# Patient Record
Sex: Female | Born: 1970 | Race: White | Hispanic: No | Marital: Married
Health system: Southern US, Community
[De-identification: ages and names within clinical notes are randomized; demographics above are authoritative.]

---

## 2011-09-02 ENCOUNTER — Encounter (HOSPITAL_COMMUNITY): Payer: Self-pay

## 2011-09-08 ENCOUNTER — Other Ambulatory Visit (HOSPITAL_COMMUNITY): Payer: Self-pay | Admitting: *Deleted

## 2011-09-09 ENCOUNTER — Ambulatory Visit (HOSPITAL_COMMUNITY)
Admission: RE | Admit: 2011-09-09 | Discharge: 2011-09-09 | Disposition: A | Discharge status: Home / Self Care | Payer: BC Managed Care – PPO | Source: Ambulatory Visit | Attending: Endocrinology | Admitting: Endocrinology

## 2011-09-09 DIAGNOSIS — R110 Nausea: Secondary | ICD-10-CM | POA: Insufficient documentation

## 2011-09-09 DIAGNOSIS — R5383 Other fatigue: Secondary | ICD-10-CM | POA: Insufficient documentation

## 2011-09-09 DIAGNOSIS — R5381 Other malaise: Secondary | ICD-10-CM | POA: Insufficient documentation

## 2011-09-09 MED ORDER — SODIUM CHLORIDE 0.9 % IV SOLN
Freq: Once | INTRAVENOUS | Status: AC
  Administered 2011-09-09: 09:00:00 via INTRAVENOUS

## 2011-09-09 MED ORDER — COSYNTROPIN 0.25 MG IJ SOLR
0.2500 mg | Freq: Once | INTRAMUSCULAR | Status: AC
  Administered 2011-09-09: 0.25 mg via INTRAVENOUS
  Filled 2011-09-09: qty 0.25

## 2011-09-10 LAB — ACTH: C206 ACTH: 10 pg/mL (ref 10–46)

## 2011-09-10 LAB — ACTH STIMULATION, 3 TIME POINTS: Cortisol, 60 Min: 33.8 ug/dL (ref >20)

## 2011-09-14 ENCOUNTER — Encounter (HOSPITAL_COMMUNITY): Payer: Self-pay

## 2014-12-03 ENCOUNTER — Other Ambulatory Visit: Payer: Self-pay | Admitting: Obstetrics and Gynecology

## 2014-12-03 DIAGNOSIS — N6320 Unspecified lump in the left breast, unspecified quadrant: Secondary | ICD-10-CM

## 2014-12-11 ENCOUNTER — Other Ambulatory Visit: Payer: BC Managed Care – PPO

## 2014-12-13 ENCOUNTER — Ambulatory Visit
Admission: RE | Admit: 2014-12-13 | Discharge: 2014-12-13 | Disposition: A | Discharge status: Home / Self Care | Payer: BC Managed Care – PPO | Source: Ambulatory Visit | Attending: Obstetrics and Gynecology | Admitting: Obstetrics and Gynecology

## 2014-12-13 DIAGNOSIS — N6320 Unspecified lump in the left breast, unspecified quadrant: Secondary | ICD-10-CM

## 2014-12-13 IMAGING — US US BREAST LTD UNI LEFT INC AXILLA
1 series · 4 of 4 positions shown · non-contrast
Comparison: Previous exam(s).

CLINICAL DATA: 44-year-old female presenting for evaluation of a
palpable area of concern in the left breast.

EXAM:
DIGITAL DIAGNOSTIC BILATERAL MAMMOGRAM WITH 3D TOMOSYNTHESIS AND CAD
LEFT BREAST ULTRASOUND

[Series 1: us breast ltd uni left inc axilla · 0.05mm/px · 4 of 4 slices shown]
[im 1/4]
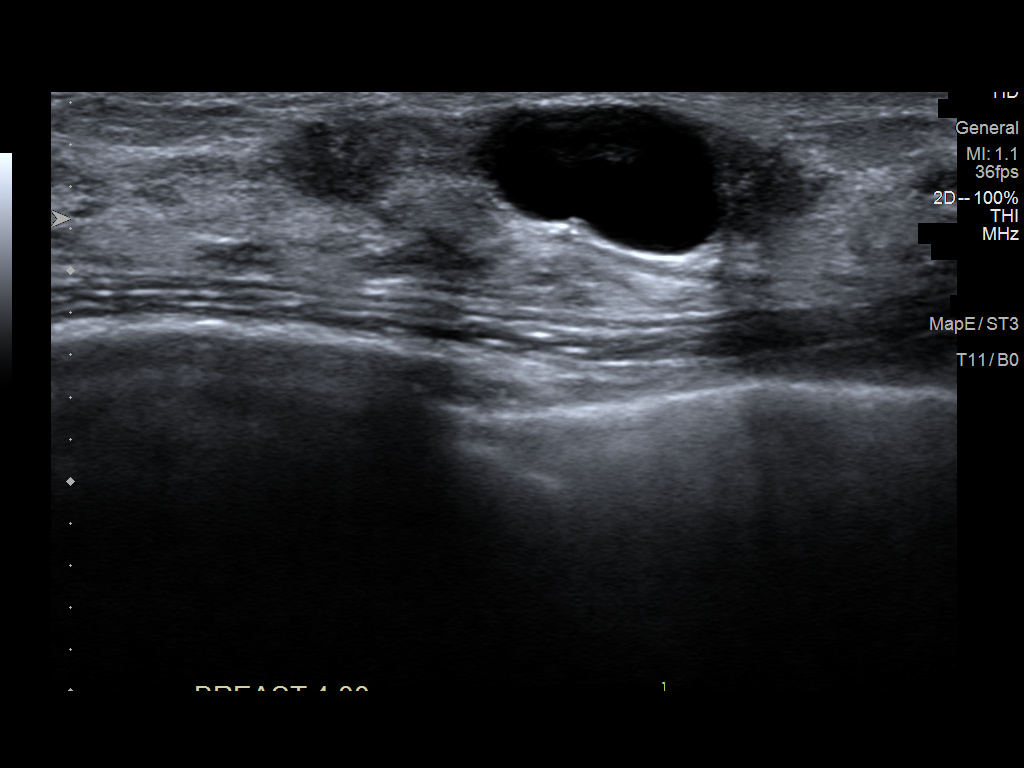
[im 2/4]
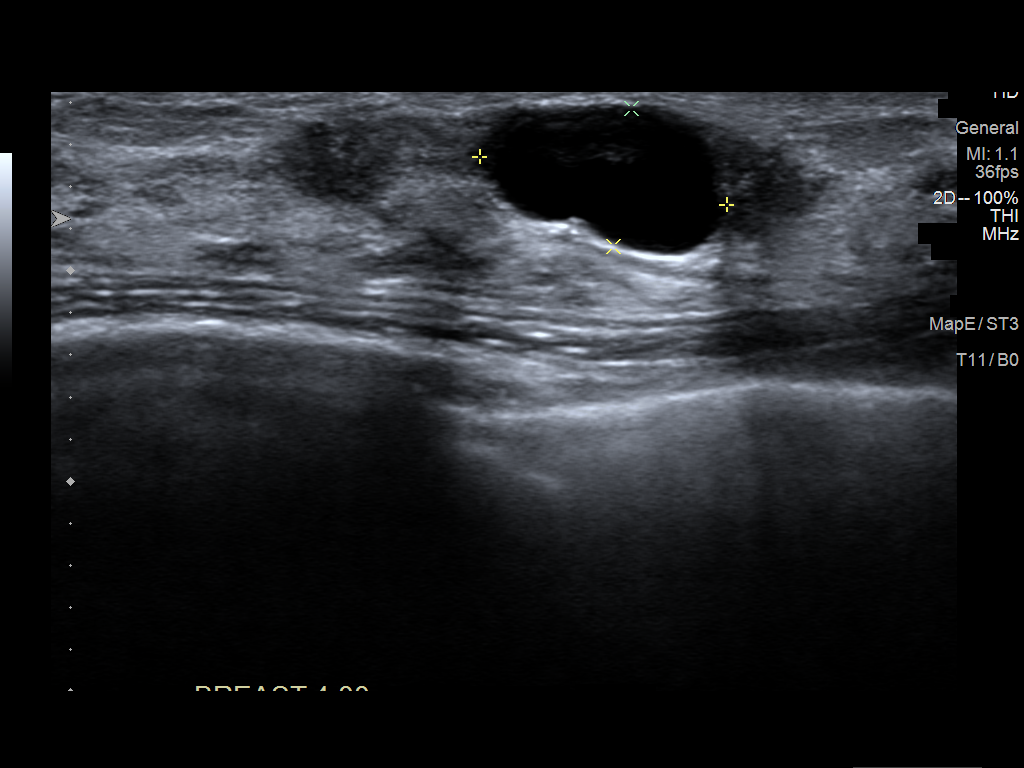
[im 3/4]
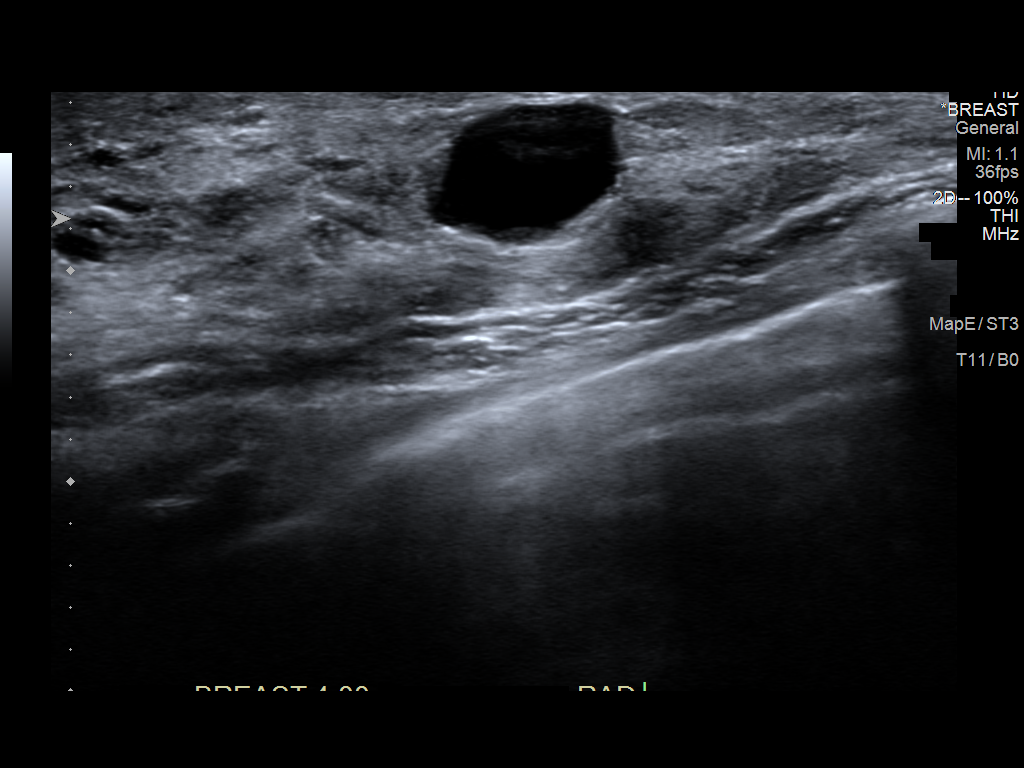
[im 4/4]
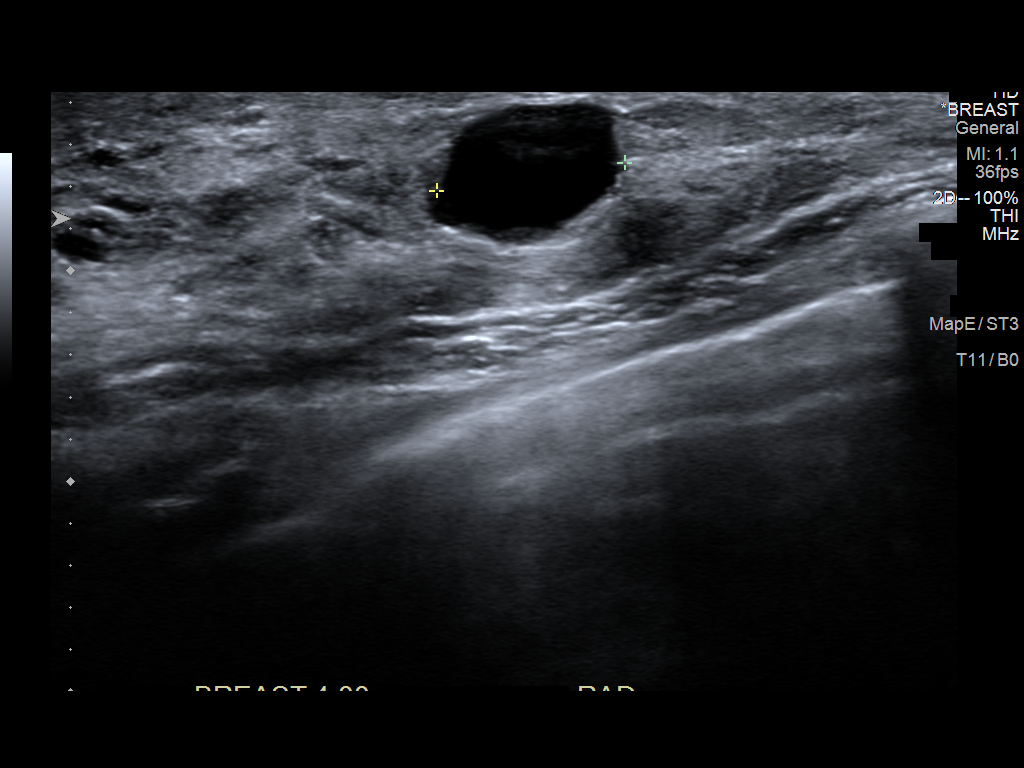

[4 of 4 positions shown; findings below may reference images not displayed]

ACR Breast Density Category d: The breast tissue is extremely dense,
which lowers the sensitivity of mammography.
FINDINGS: A palpable marker has been placed on the lateral left breast in the
anterior depth. No discrete mass or other mammographic abnormalities
are seen deep to the palpable marker. In the medial left breast in
the middle depth, there is a 2 cm area of loosely grouped punctate
calcifications in the medial left breast, which which compared with
the [7L] exam has not significantly changed. There are no suspicious
linear or segmental configurations or suspicious morphology of the
calcifications.

Mammographic images were processed with CAD.

Physical exam of the palpable area of concern in the left breast
demonstrates a firm smooth mobile subcentimeter mass in the
subareolar left breast at 4 o'clock.

Ultrasound targeted to the palpable area demonstrates a
circumscribed anechoic mass with increased posterior acoustic
enhancement measuring 1.2 x 0.7 x 0.9 cm, consistent with a benign
cyst.
IMPRESSION: 1. The palpable mass of concern in the left breast is consistent
with a benign cyst.

2.  No mammographic evidence of malignancy in the bilateral breasts.

RECOMMENDATION:
Screening mammogram in one year.(Code:[7L])

I have discussed the findings and recommendations with the patient.
Results were also provided in writing at the conclusion of the
visit. If applicable, a reminder letter will be sent to the patient
regarding the next appointment.

BI-RADS CATEGORY  2: Benign.

## 2016-07-22 ENCOUNTER — Other Ambulatory Visit: Payer: Self-pay | Admitting: Obstetrics and Gynecology

## 2016-07-22 DIAGNOSIS — Z1231 Encounter for screening mammogram for malignant neoplasm of breast: Secondary | ICD-10-CM

## 2016-07-29 ENCOUNTER — Ambulatory Visit
Admission: RE | Admit: 2016-07-29 | Discharge: 2016-07-29 | Disposition: A | Discharge status: Home / Self Care | Payer: BC Managed Care – PPO | Source: Ambulatory Visit | Attending: Obstetrics and Gynecology | Admitting: Obstetrics and Gynecology

## 2016-07-29 DIAGNOSIS — Z1231 Encounter for screening mammogram for malignant neoplasm of breast: Secondary | ICD-10-CM

## 2016-07-29 IMAGING — MG 2D DIGITAL SCREENING BILATERAL MAMMOGRAM WITH CAD AND ADJUNCT TO
6 series · 6 of 10 positions shown · non-contrast
Comparison: Previous exam(s).

CLINICAL DATA: Screening.

EXAM:
2D DIGITAL SCREENING BILATERAL MAMMOGRAM WITH CAD AND ADJUNCT TOMO

[L MLO]
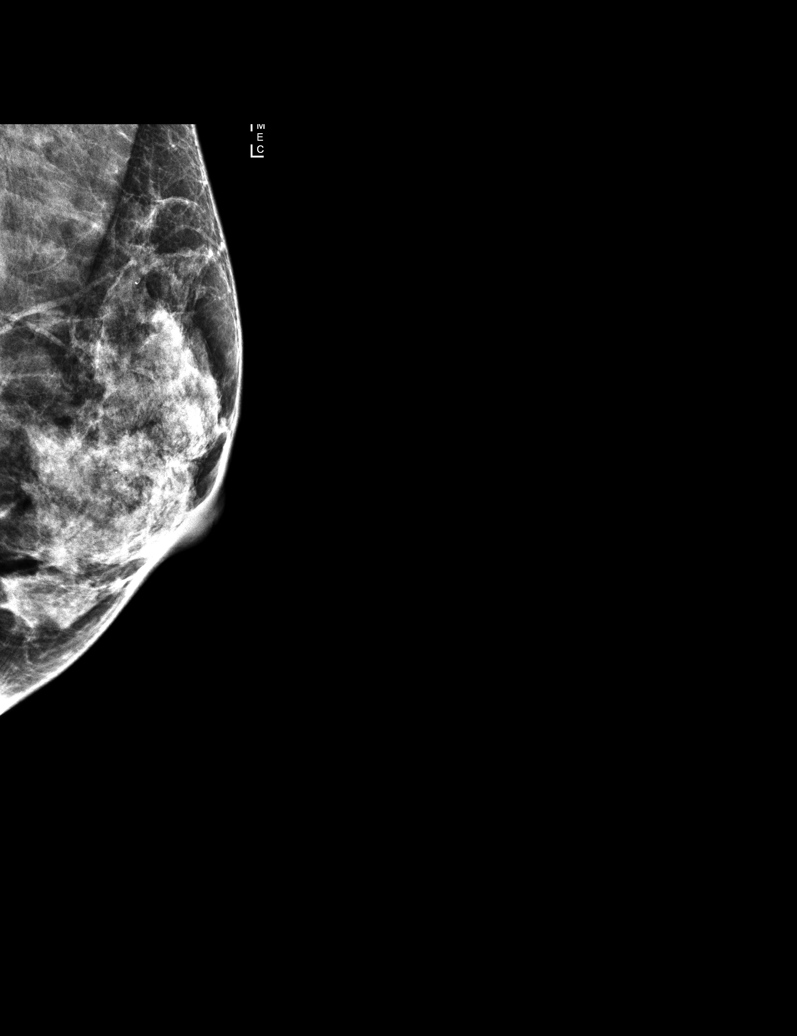

[L CC synth-2D]
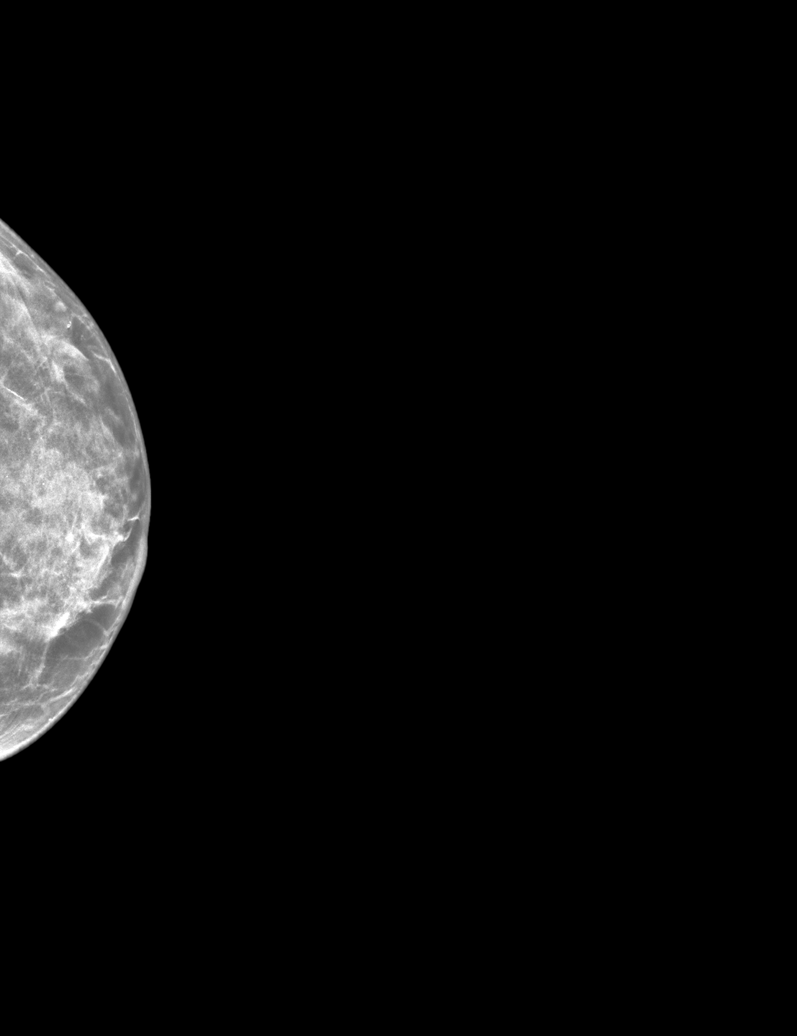

[R MLO synth-2D]
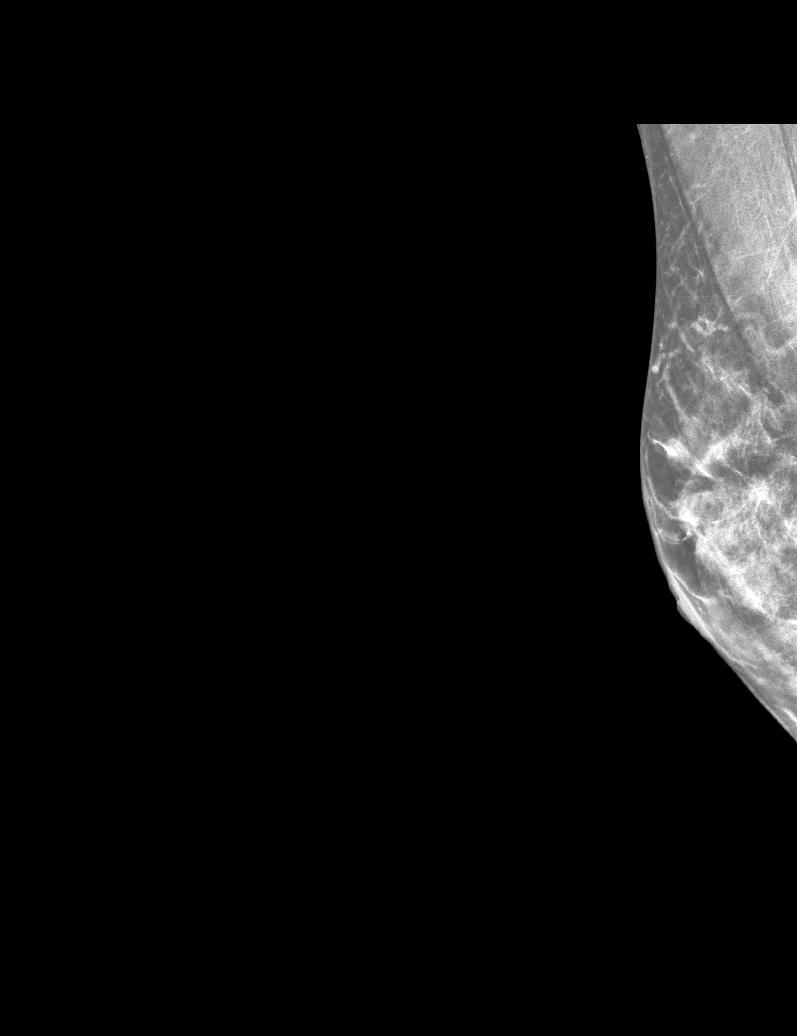

[R CC]
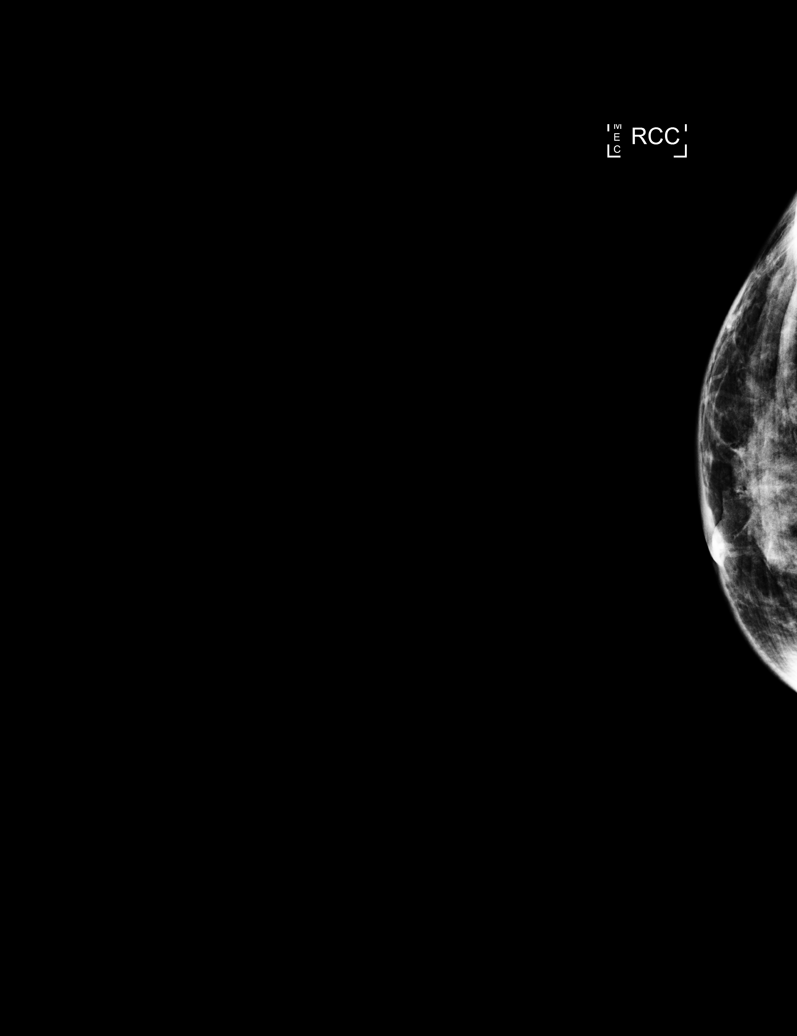

[R CC synth-2D]
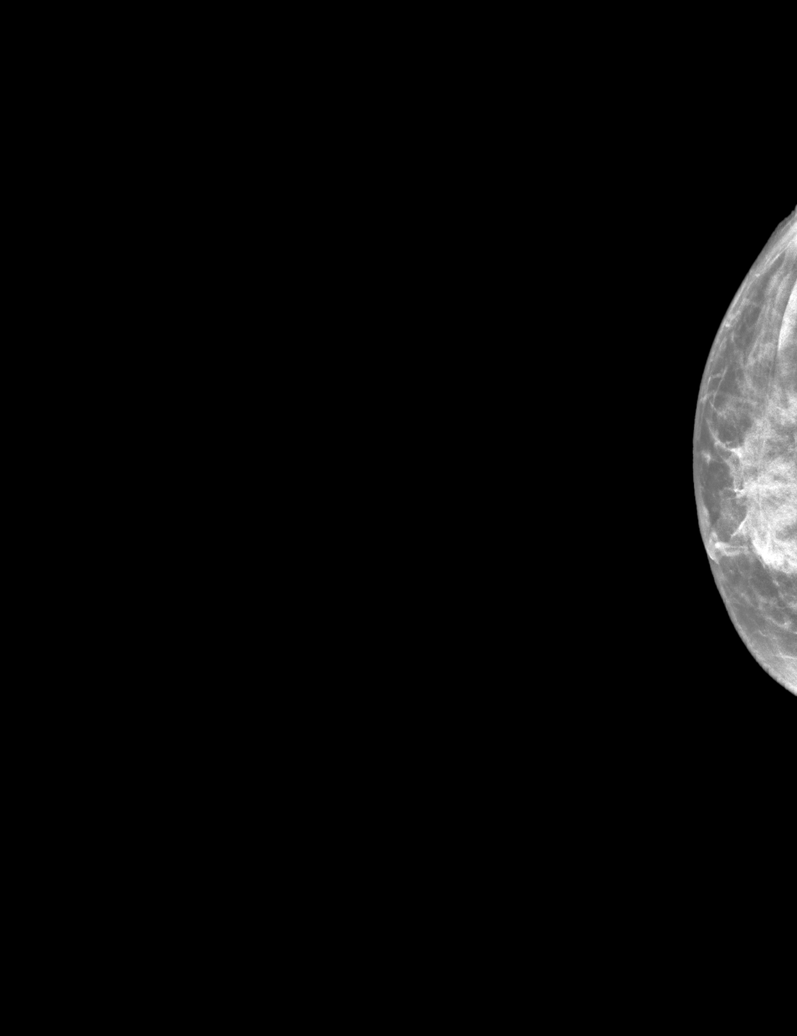

[L CC tomo · tomo slice 22/43.0]
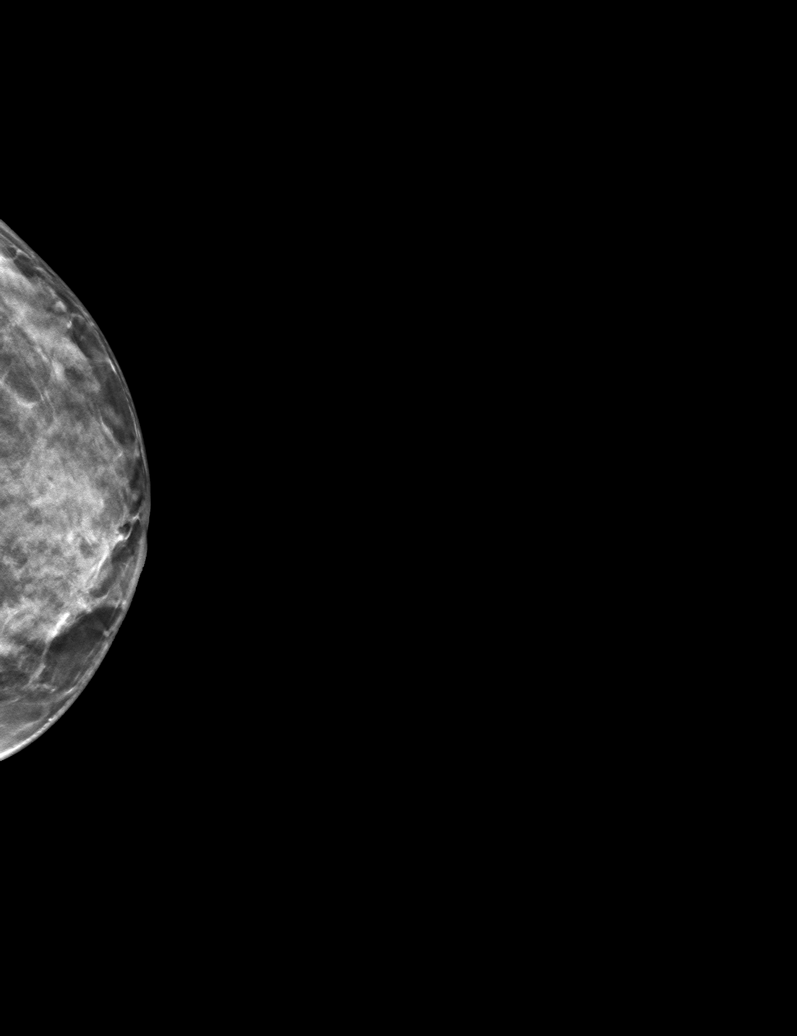

[6 of 10 positions shown; findings below may reference images not displayed]

ACR Breast Density Category c: The breast tissue is heterogeneously
dense, which may obscure small masses.
FINDINGS: There are no findings suspicious for malignancy. Images were
processed with CAD.
IMPRESSION: No mammographic evidence of malignancy. A result letter of this
screening mammogram will be mailed directly to the patient.

RECOMMENDATION:
Screening mammogram in one year. (Code:[TA])

BI-RADS CATEGORY  1: Negative.

## 2017-02-24 ENCOUNTER — Encounter (INDEPENDENT_AMBULATORY_CARE_PROVIDER_SITE_OTHER): Payer: Self-pay

## 2017-08-11 ENCOUNTER — Ambulatory Visit
Admission: RE | Admit: 2017-08-11 | Discharge: 2017-08-11 | Disposition: A | Discharge status: Home / Self Care | Payer: BC Managed Care – PPO | Source: Ambulatory Visit | Attending: Obstetrics and Gynecology | Admitting: Obstetrics and Gynecology

## 2017-08-11 ENCOUNTER — Other Ambulatory Visit: Payer: Self-pay | Admitting: Obstetrics and Gynecology

## 2017-08-11 DIAGNOSIS — Z1231 Encounter for screening mammogram for malignant neoplasm of breast: Secondary | ICD-10-CM

## 2019-07-18 ENCOUNTER — Other Ambulatory Visit: Payer: Self-pay | Admitting: Obstetrics and Gynecology

## 2019-07-18 DIAGNOSIS — Z1231 Encounter for screening mammogram for malignant neoplasm of breast: Secondary | ICD-10-CM

## 2019-07-19 ENCOUNTER — Ambulatory Visit
Admission: RE | Admit: 2019-07-19 | Discharge: 2019-07-19 | Disposition: A | Discharge status: Home / Self Care | Payer: BC Managed Care – PPO | Source: Ambulatory Visit | Attending: Obstetrics and Gynecology | Admitting: Obstetrics and Gynecology

## 2019-07-19 ENCOUNTER — Other Ambulatory Visit: Payer: Self-pay

## 2019-07-19 DIAGNOSIS — Z1231 Encounter for screening mammogram for malignant neoplasm of breast: Secondary | ICD-10-CM

## 2019-07-19 IMAGING — MG DIGITAL SCREENING BILAT W/ TOMO W/ CAD
6 of 10 series · 6 of 30 positions shown · non-contrast
Comparison: Previous exam(s).

CLINICAL DATA: Screening.

EXAM:
DIGITAL SCREENING BILATERAL MAMMOGRAM WITH TOMO AND CAD

[R CC synth-2D (1 of 2)]
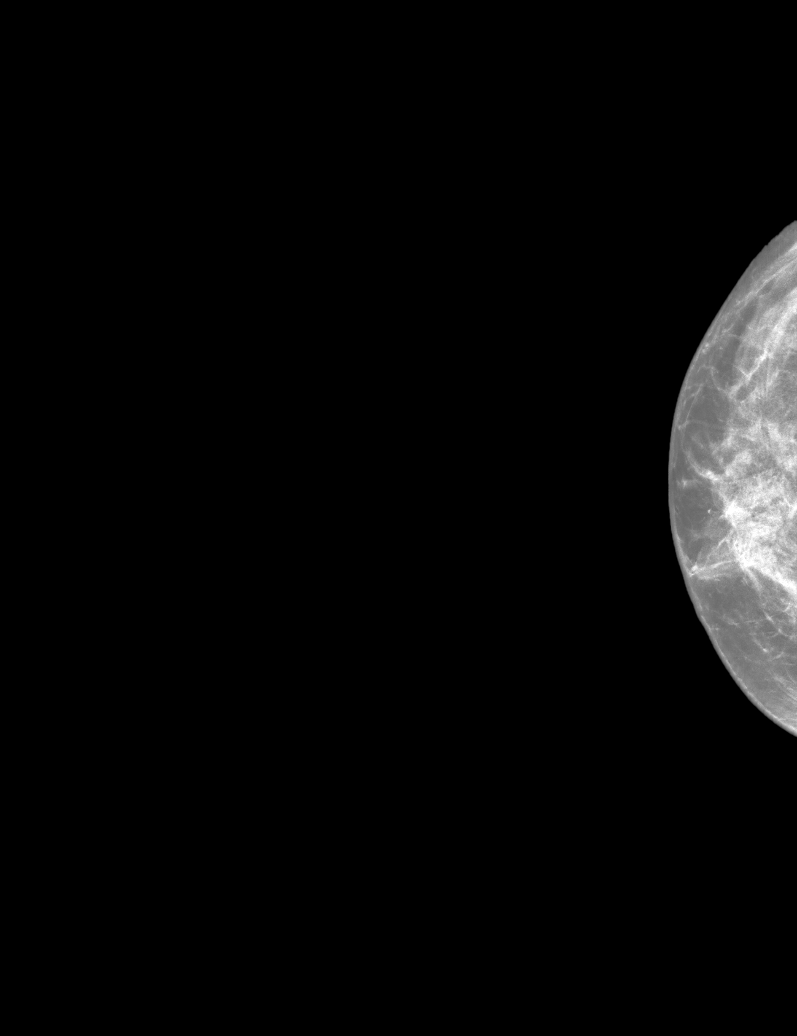

[L MLO synth-2D]
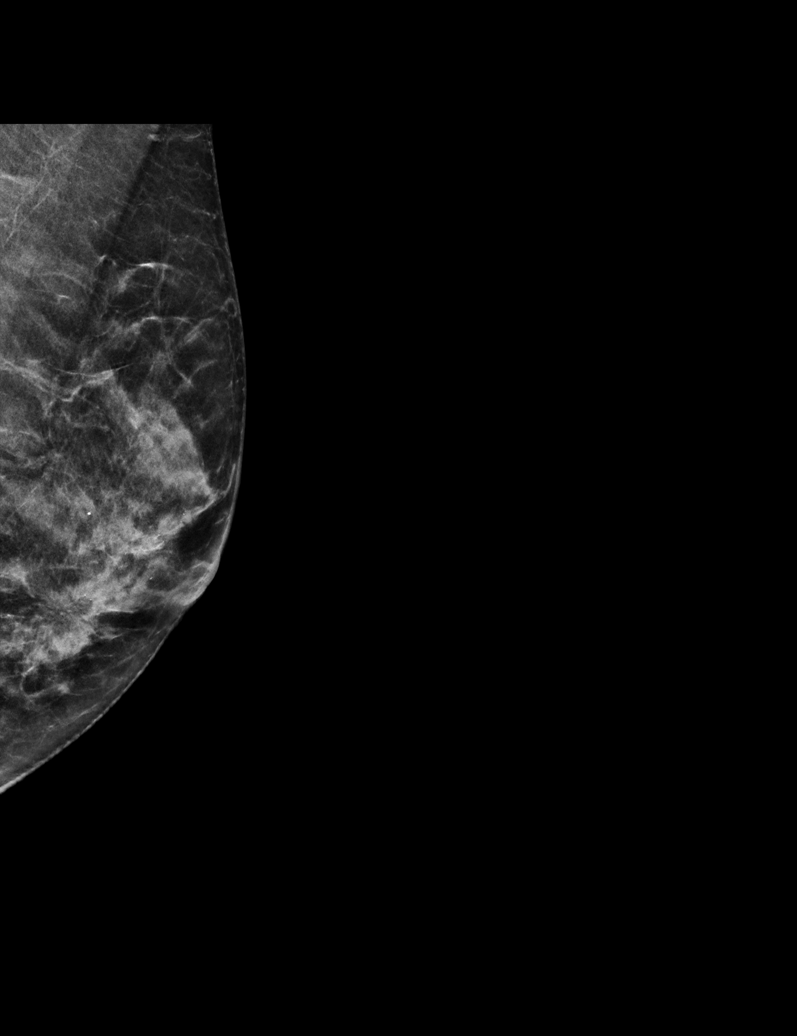

[L CC synth-2D]
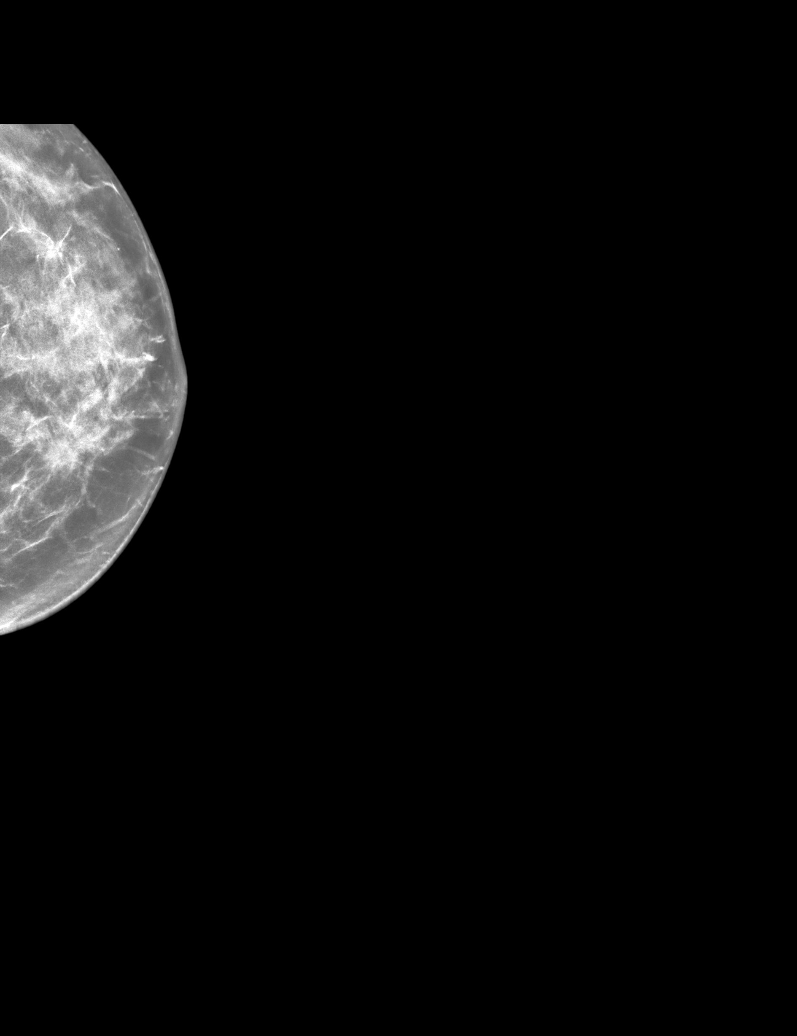

[R MLO synth-2D]
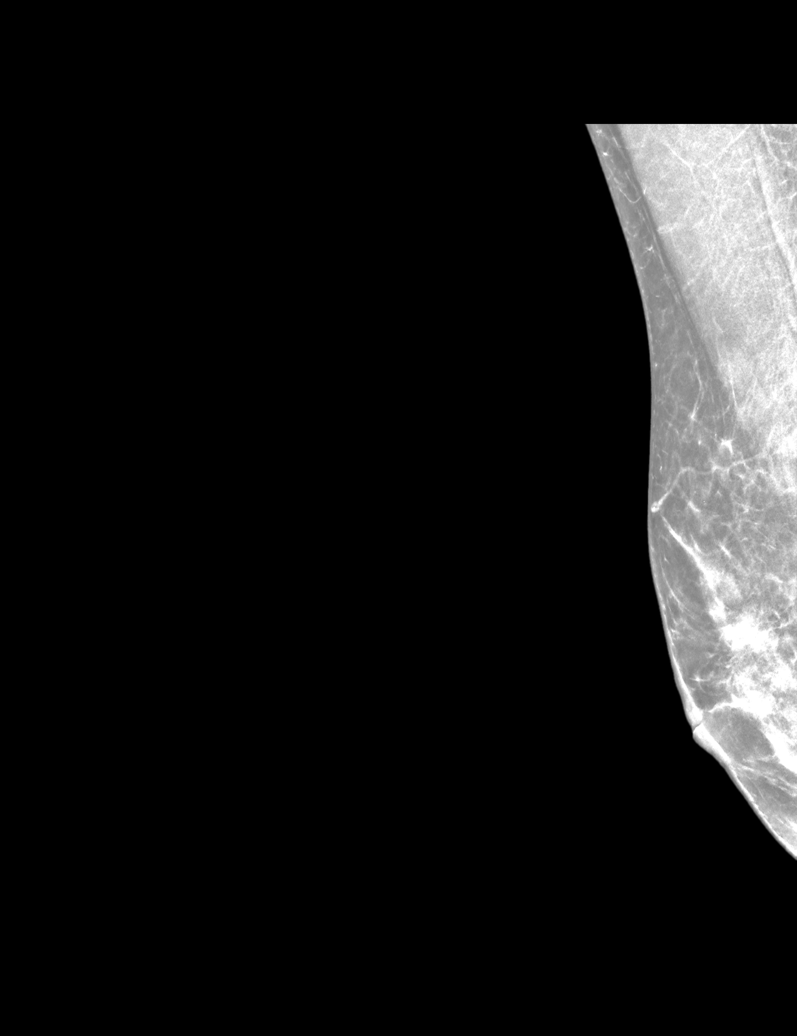

[R CC synth-2D (2 of 2)]
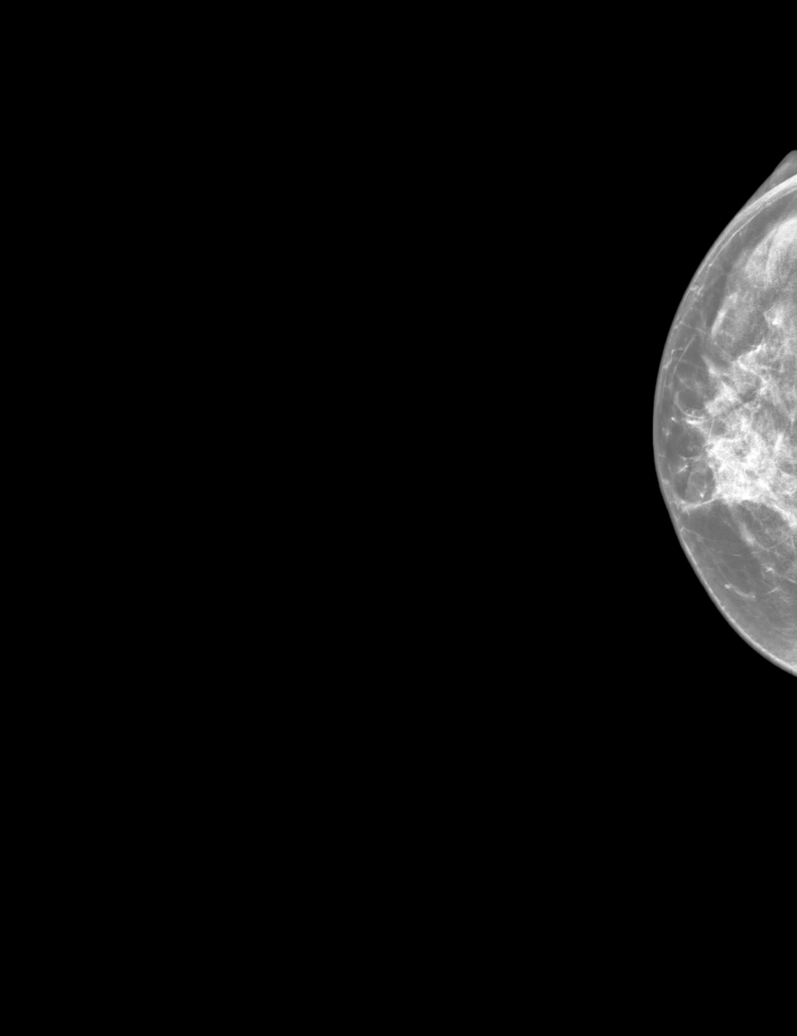

[L MLO tomo · tomo slice 22/43.0]
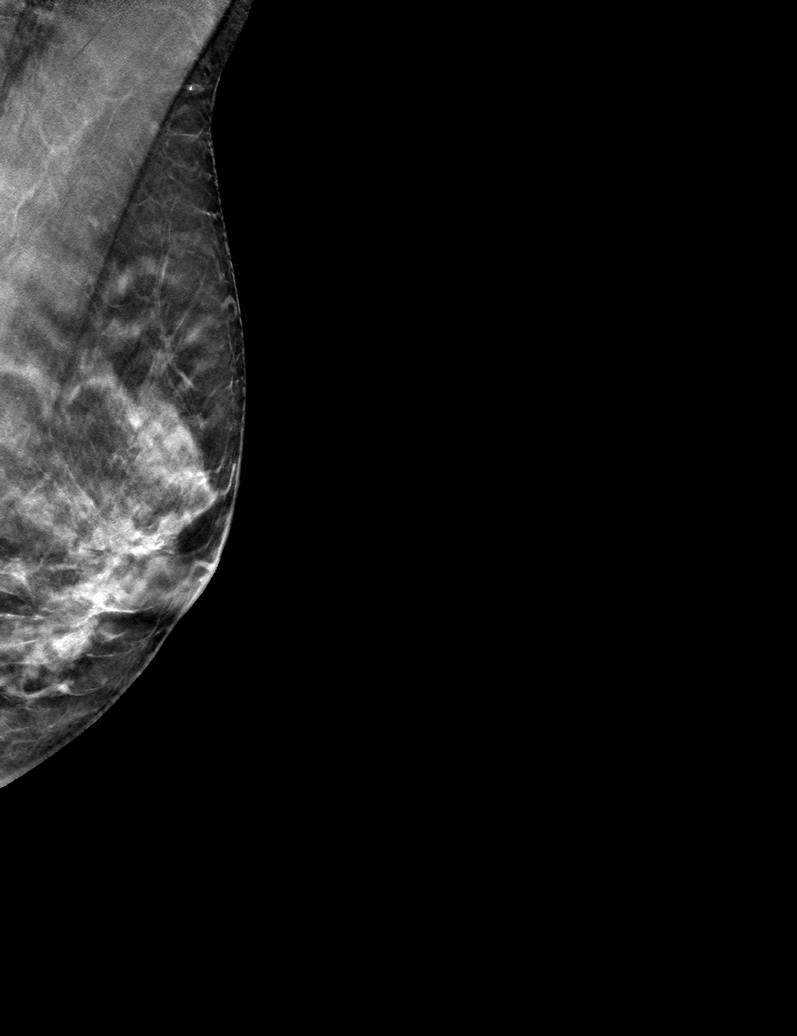

[6 of 30 positions shown; findings below may reference images not displayed]

ACR Breast Density Category c: The breast tissue is heterogeneously
dense, which may obscure small masses.
FINDINGS: In the right breast asymmetry requires further evaluation.

In the left breast mass requires further evaluation.

Images were processed with CAD.
IMPRESSION: Further evaluation is suggested for possible asymmetry in the right
breast.

Further evaluation is suggested for possible mass in the left
breast.

RECOMMENDATION:
Diagnostic mammogram and possibly ultrasound of both breasts.
(Code:[TI])

The patient will be contacted regarding the findings, and additional
imaging will be scheduled.

BI-RADS CATEGORY  0: Incomplete. Need additional imaging evaluation
and/or prior mammograms for comparison.

## 2019-07-24 ENCOUNTER — Other Ambulatory Visit: Payer: Self-pay | Admitting: Obstetrics and Gynecology

## 2019-07-24 DIAGNOSIS — R928 Other abnormal and inconclusive findings on diagnostic imaging of breast: Secondary | ICD-10-CM

## 2019-08-01 ENCOUNTER — Ambulatory Visit
Admission: RE | Admit: 2019-08-01 | Discharge: 2019-08-01 | Disposition: A | Discharge status: Home / Self Care | Payer: BC Managed Care – PPO | Source: Ambulatory Visit | Attending: Obstetrics and Gynecology | Admitting: Obstetrics and Gynecology

## 2019-08-01 ENCOUNTER — Other Ambulatory Visit: Payer: Self-pay

## 2019-08-01 ENCOUNTER — Other Ambulatory Visit: Payer: Self-pay | Admitting: Obstetrics and Gynecology

## 2019-08-01 DIAGNOSIS — R928 Other abnormal and inconclusive findings on diagnostic imaging of breast: Secondary | ICD-10-CM

## 2019-08-01 IMAGING — US US BREAST*L* LIMITED INC AXILLA
1 series · 13 of 25 positions shown · non-contrast
Comparison: Previous exam(s).

CLINICAL DATA: Patient recalled from screening areas of distortion.

EXAM:
DIGITAL DIAGNOSTIC BILATERAL MAMMOGRAM WITH CAD AND TOMO
ULTRASOUND BILATERAL BREAST

[Series 1: us breast*left* limited inc axilla · 0.06mm/px · 13 of 27 slices shown]
[im 1/27]
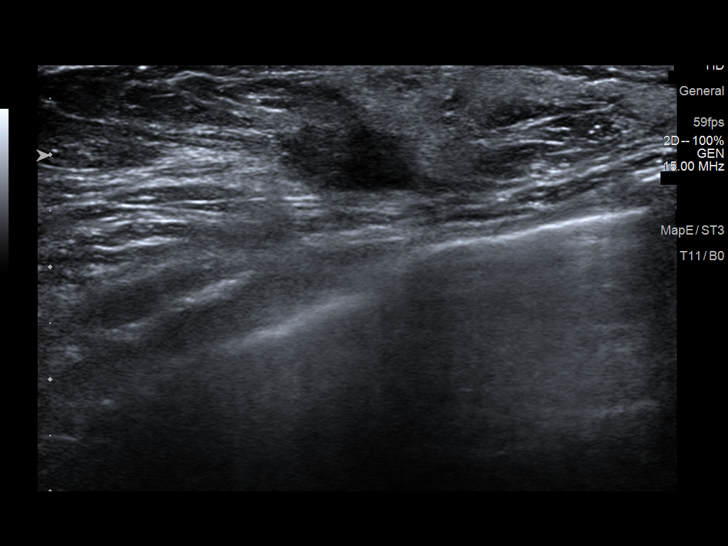
[im 3/27]
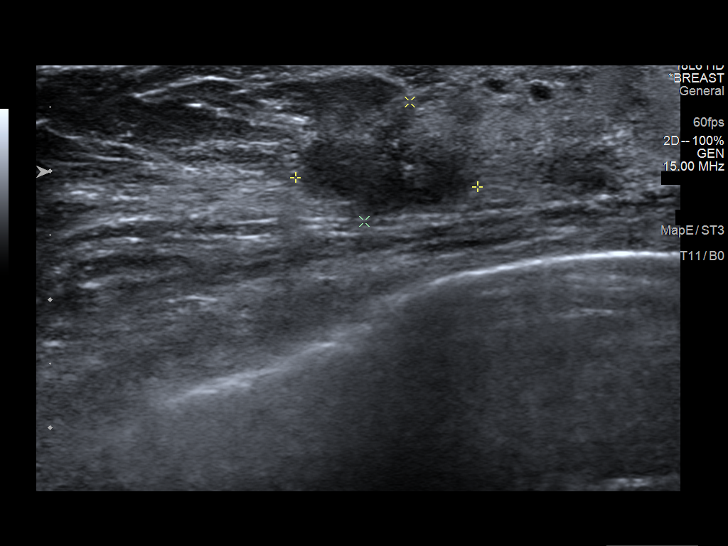
[im 5/27]
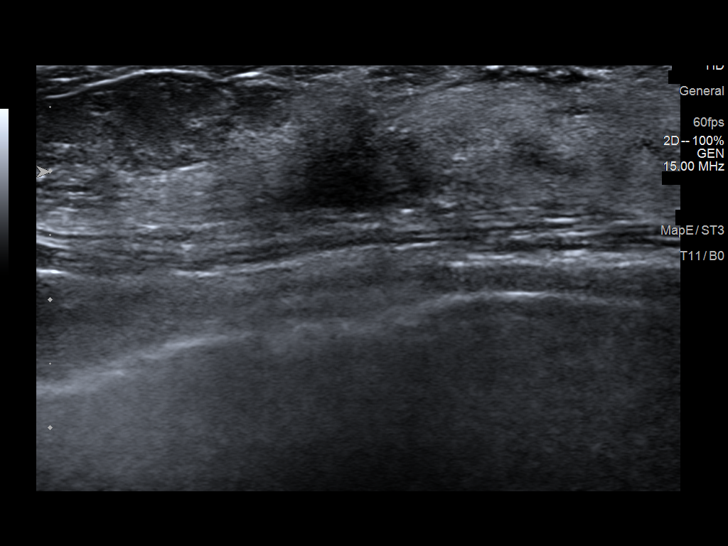
[im 7/27]
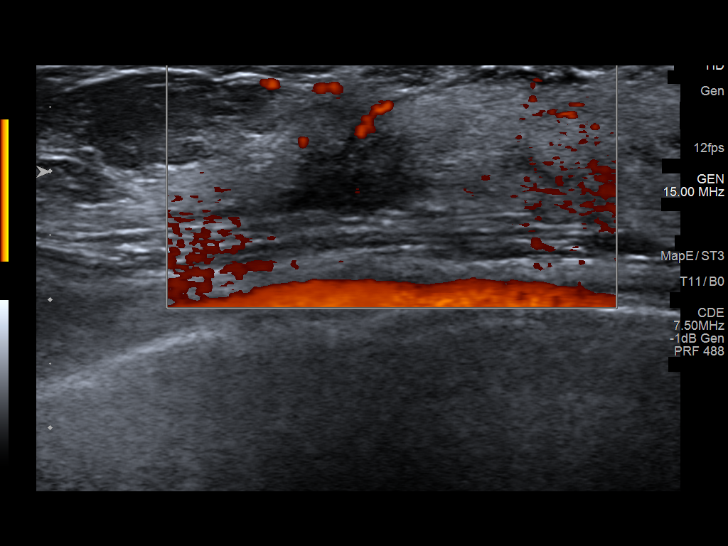
[im 9/27]
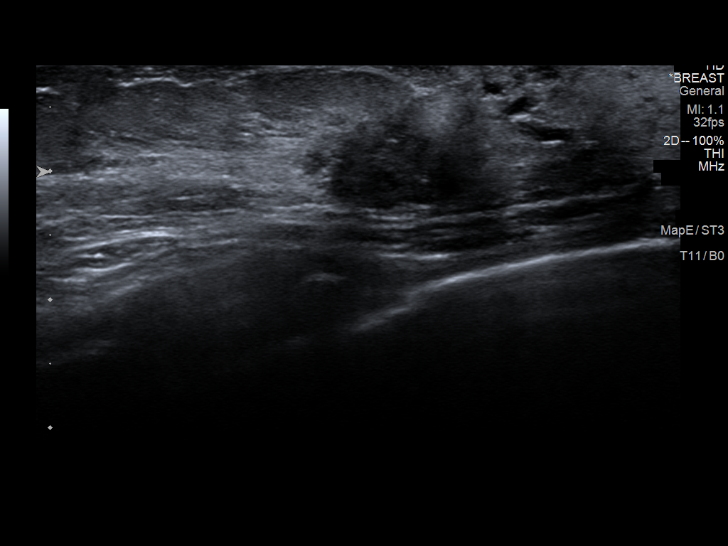
[im 11/27]
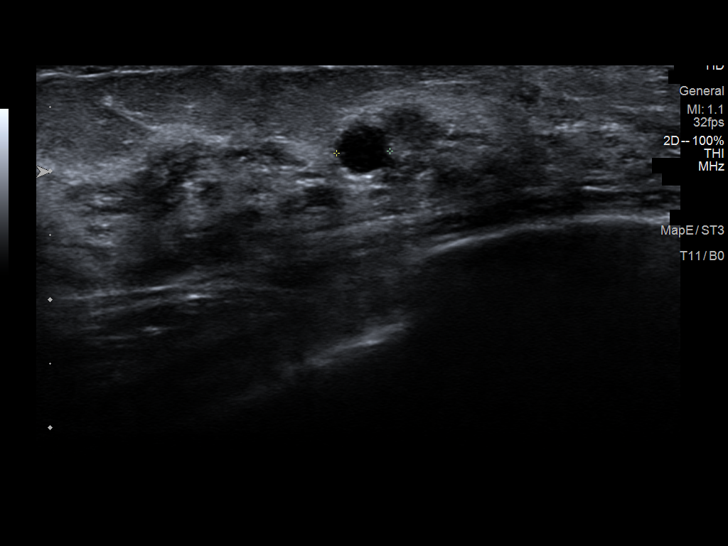
[im 14/27]
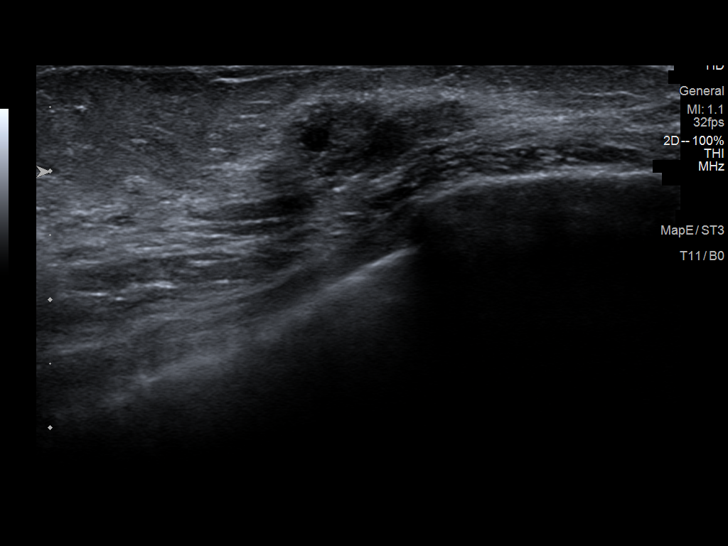
[im 16/27]
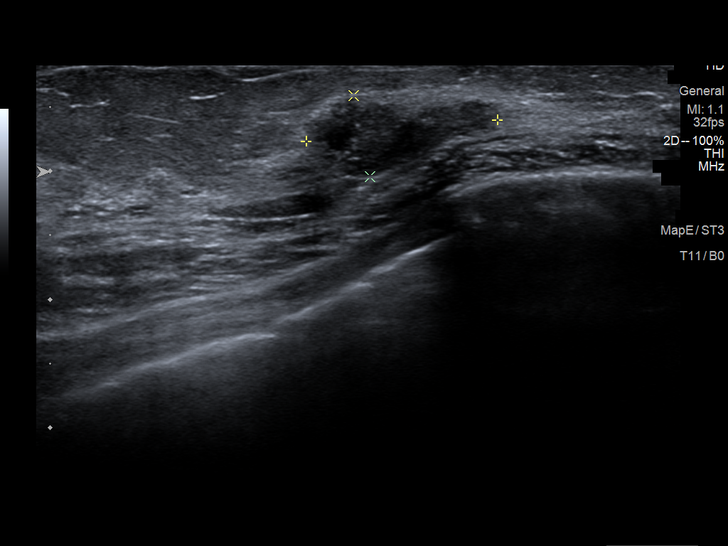
[im 18/27]
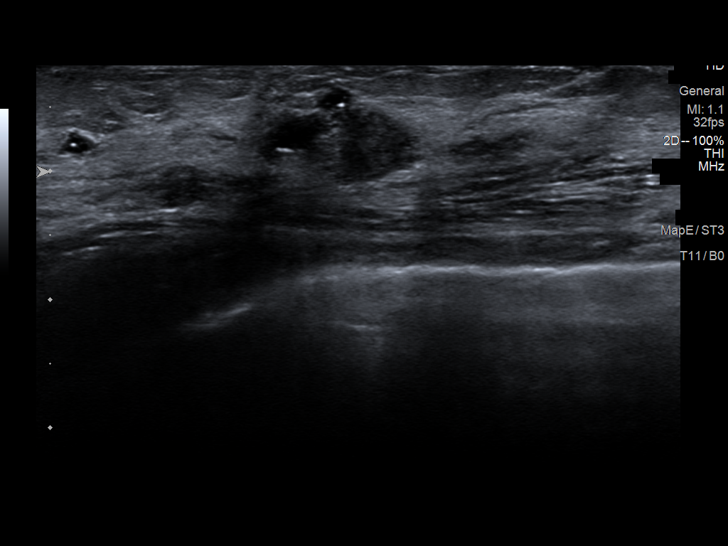
[im 20/27]
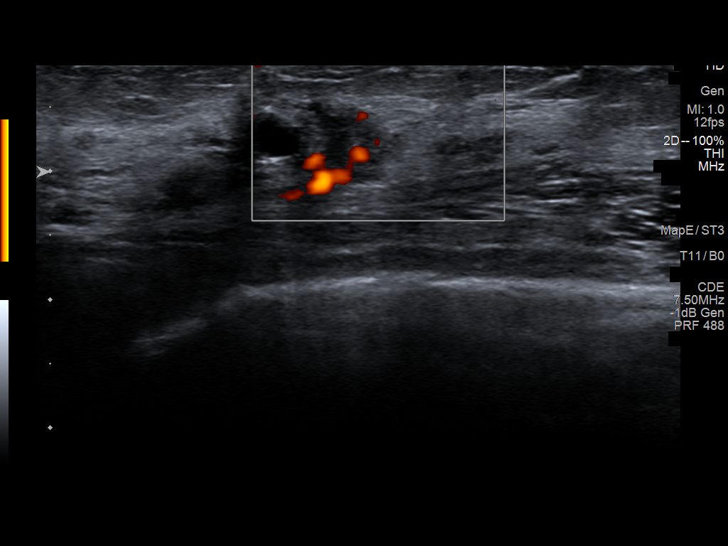
[im 22/27]
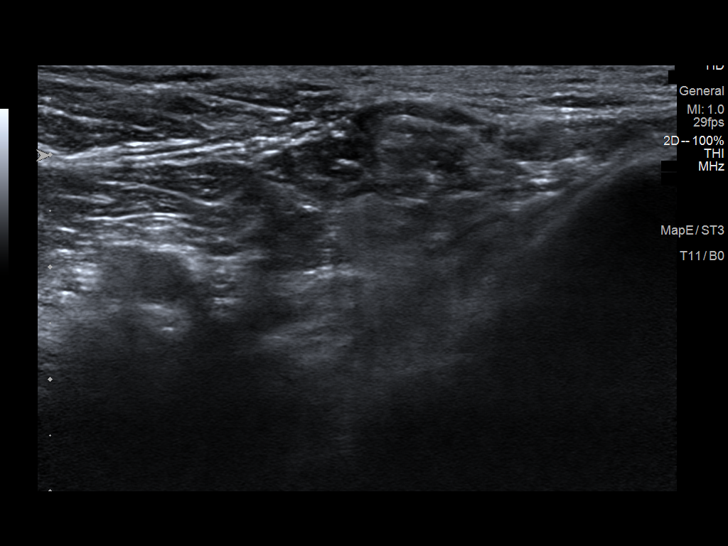
[im 24/27]
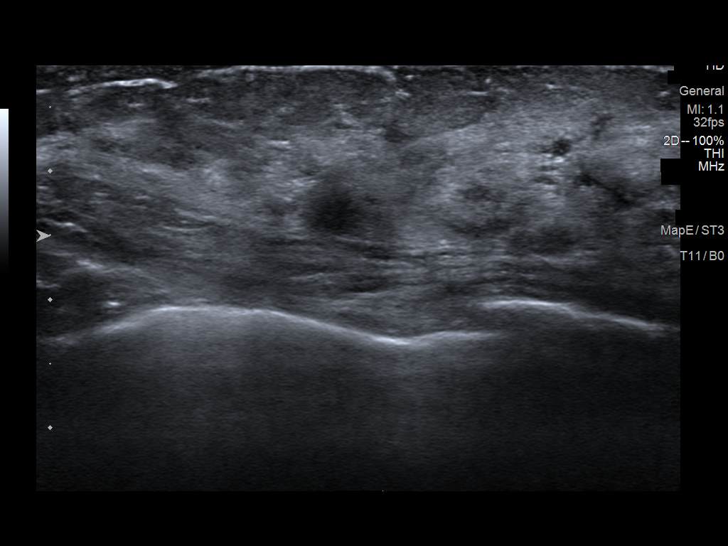
[im 27/27]
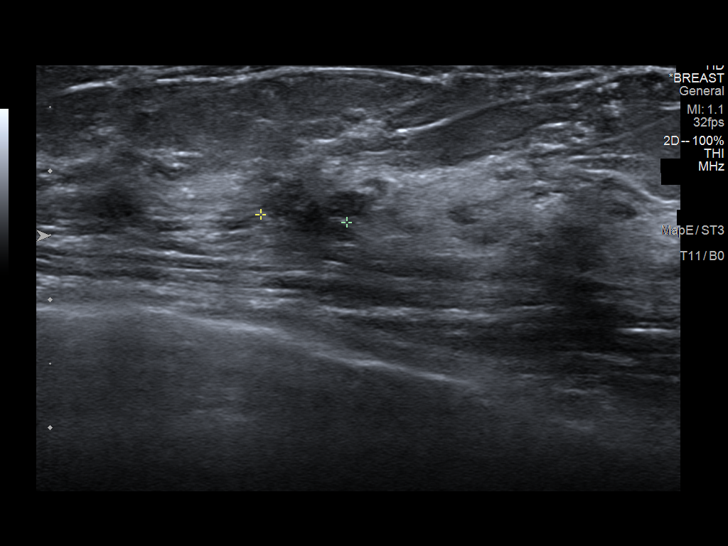

[13 of 25 positions shown; findings below may reference images not displayed]

ACR Breast Density Category c: The breast tissue is heterogeneously
dense, which may obscure small masses.
FINDINGS: Within the superior right breast there is a persistent focal area of
architectural distortion.

There is a persistent focal area of architectural distortion within
the lower inner left breast middle depth. Additionally, there is
suggestion of distortion within the outer left breast anterior and
posterior depth.

Mammographic images were processed with CAD.

Targeted ultrasound is performed, showing a 3.2 x 1.5 x 3.5 cm
irregular mixed echogenicity mass right breast 10:30 o'clock 1 cm
from the nipple.

There is an additional 0.7 x 0.3 x 0.6 cm irregular hypoechoic mass
right breast 10 o'clock position 3 cm from nipple.

Within the right breast 5 o'clock position 4 cm from nipple there is
a 0.8 x 0.5 x 0.5 cm irregular hypoechoic mass.

No right axillary adenopathy.

Within the left breast 7 o'clock position 1 cm from the nipple there
is a 1.4 x 1.0 x 1.6 cm irregular hypoechoic mass.

Within the left breast 1 o'clock position 3 cm from nipple there is
a 1.5 x 0.6 x 0.8 cm irregular hypoechoic mass.

Within the left breast 10 o'clock position 3 cm from nipple there is
a 7 x 4 x 7 mm irregular hypoechoic mass.

No left axillary adenopathy.
IMPRESSION: Bilateral breast masses concerning for bilateral breast malignancy.

RECOMMENDATION:
Recommend ultrasound-guided core needle biopsy of the dominant right
breast mass 10:30 o'clock 1 cm from nipple.

Recommend ultrasound-guided core needle biopsy left breast mass 7
o'clock position 1 cm from the nipple.

Recommend ultrasound-guided core needle biopsy left breast mass 1
o'clock position 3 cm from nipple.

Recommend bilateral breast MRI after the above biopsies to evaluate
the extent of disease as there multiple additional areas of
shadowing and smaller masses demonstrated within the right and left
breast concerning for extensive disease bilaterally.

I have discussed the findings and recommendations with the patient.
If applicable, a reminder letter will be sent to the patient
regarding the next appointment.

BI-RADS CATEGORY  5: Highly suggestive of malignancy.

## 2019-08-01 IMAGING — US US BREAST*R* LIMITED INC AXILLA
2 series · 13 of 25 positions shown · non-contrast
Comparison: Previous exam(s).

CLINICAL DATA: Patient recalled from screening areas of distortion.

EXAM:
DIGITAL DIAGNOSTIC BILATERAL MAMMOGRAM WITH CAD AND TOMO
ULTRASOUND BILATERAL BREAST

[Series 1: us breast*right* limited inc axilla · 0.06mm/px · 11 of 22 slices shown (1 of 2)]
[im 1/22]
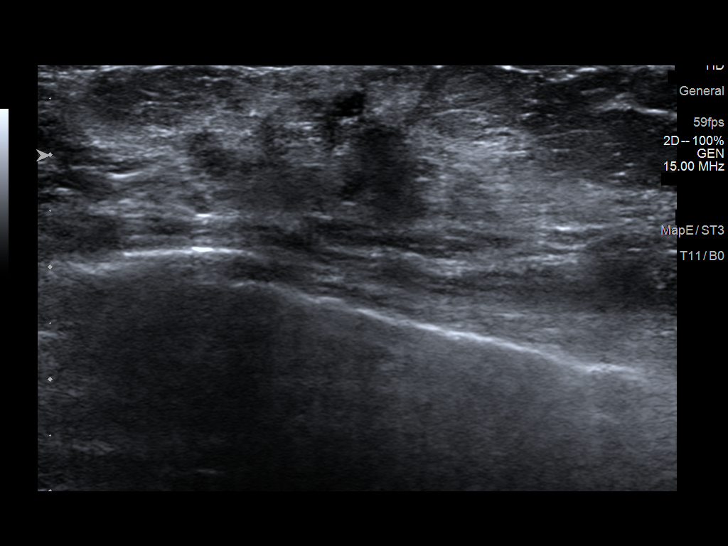
[im 3/22]
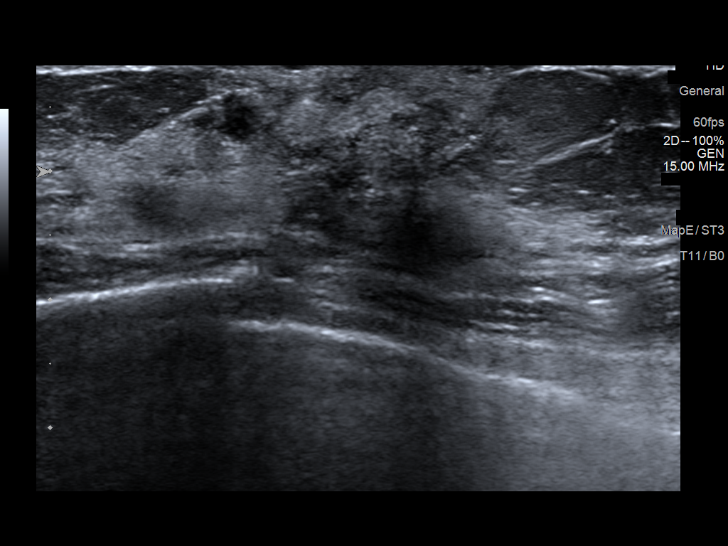
[im 5/22]
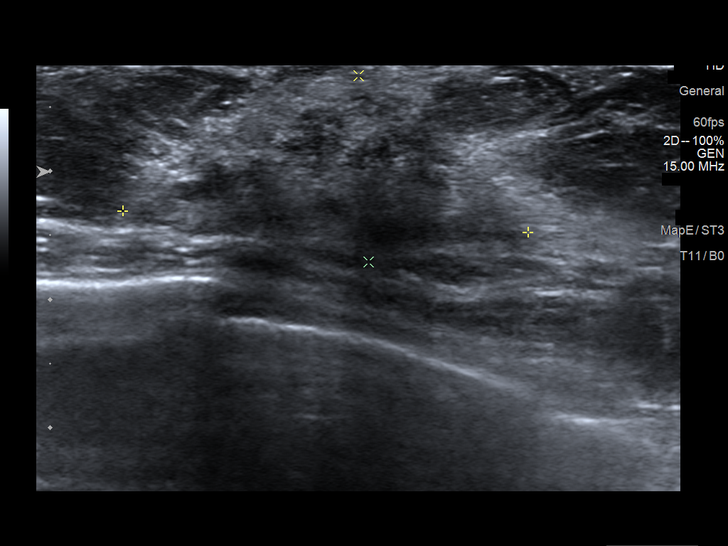
[im 7/22]
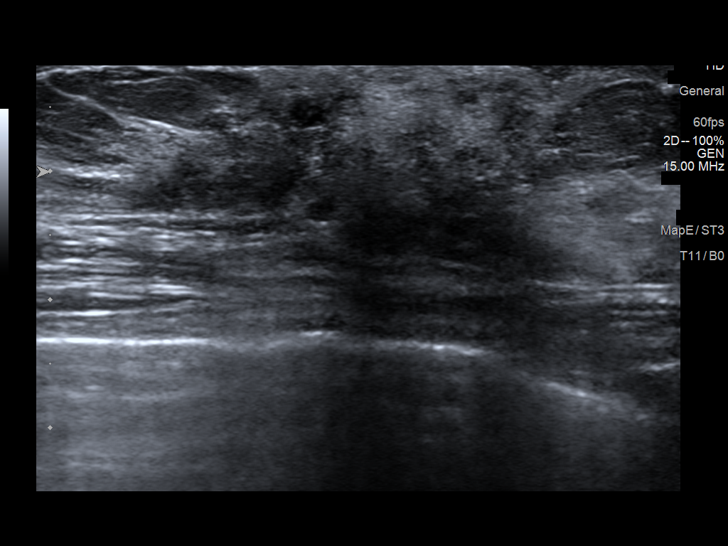
[im 9/22]
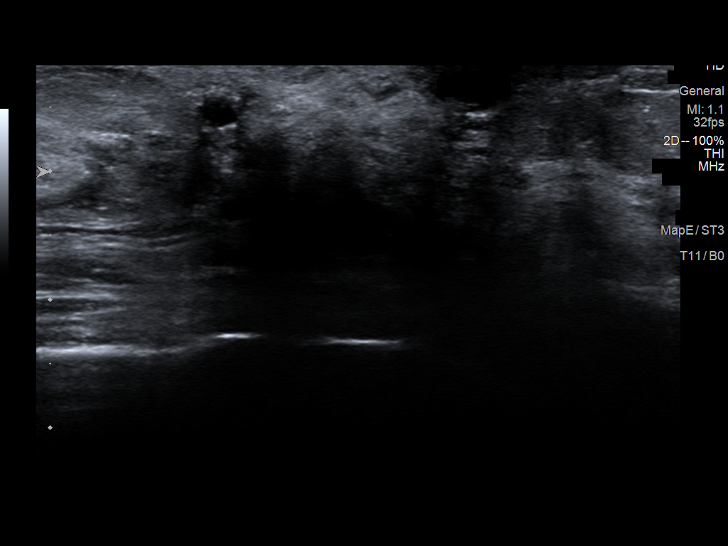
[im 11/22]
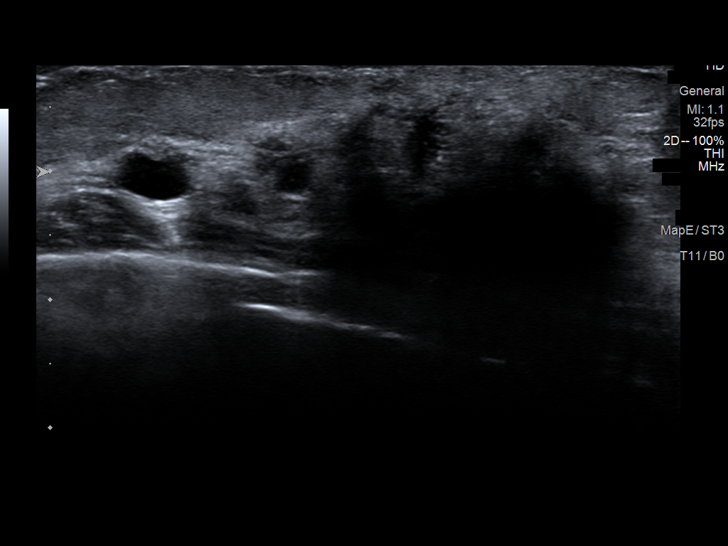
[im 13/22]
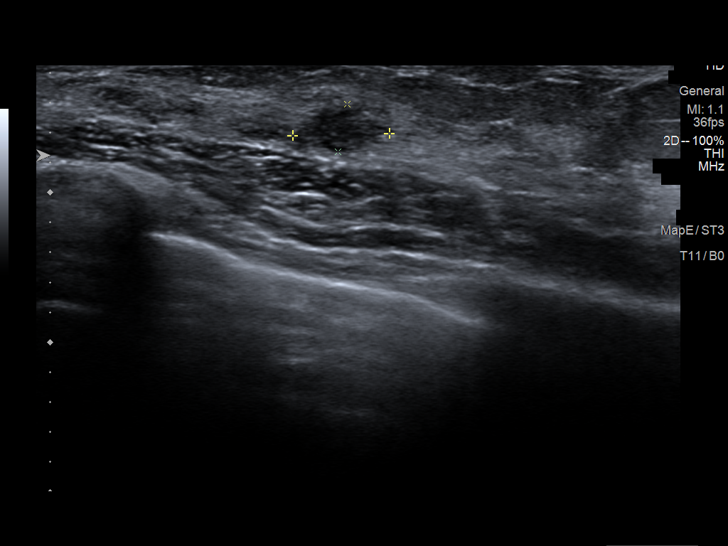
[im 15/22]
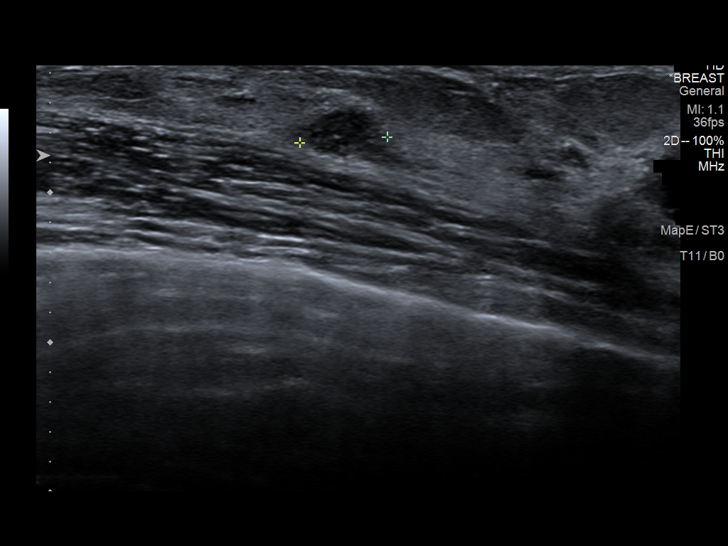
[im 17/22]
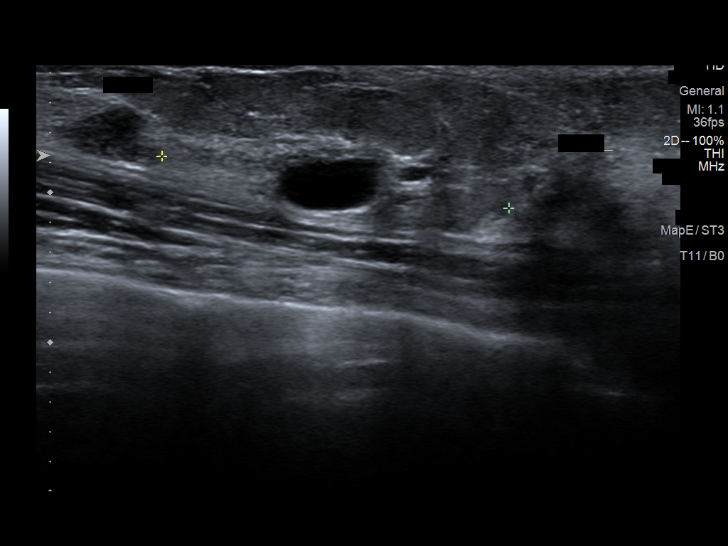
[im 19/22]
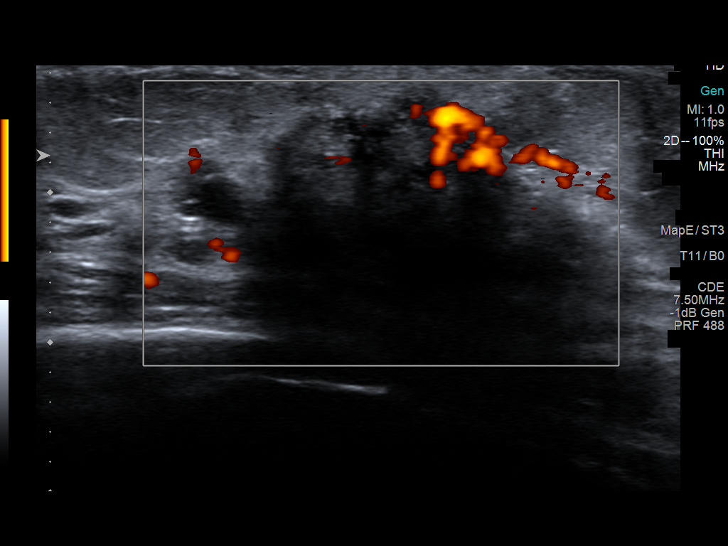
[im 22/22]
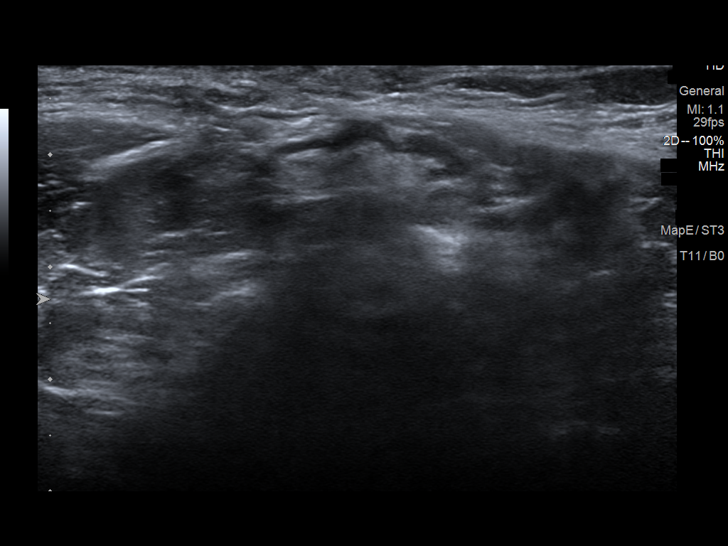

[Series 2: us breast*right* limited inc axilla · 0.05mm/px · 2 of 4 slices shown (2 of 2)]
[im 2/4]
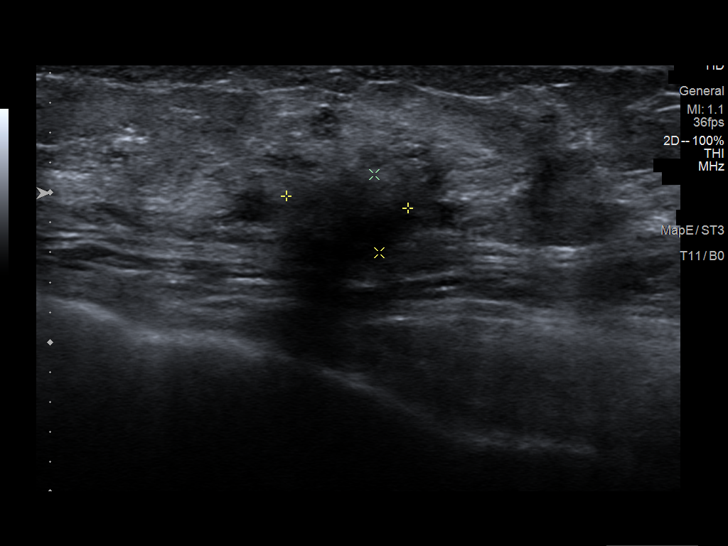
[im 4/4]
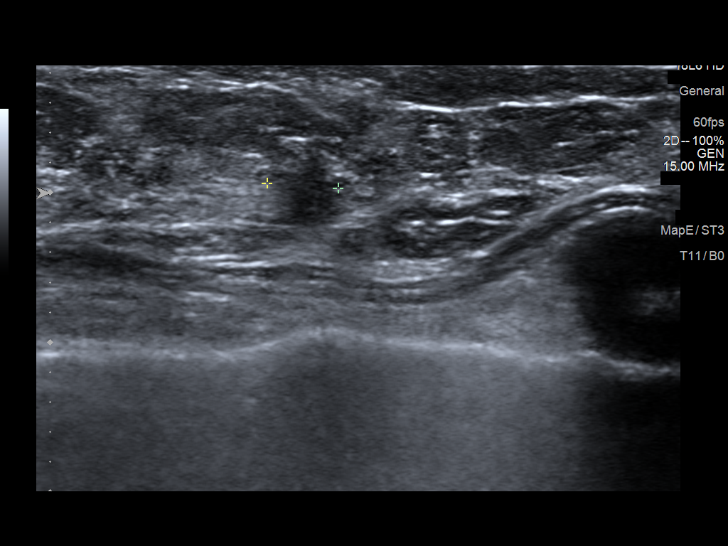

[13 of 25 positions shown; findings below may reference images not displayed]

ACR Breast Density Category c: The breast tissue is heterogeneously
dense, which may obscure small masses.
FINDINGS: Within the superior right breast there is a persistent focal area of
architectural distortion.

There is a persistent focal area of architectural distortion within
the lower inner left breast middle depth. Additionally, there is
suggestion of distortion within the outer left breast anterior and
posterior depth.

Mammographic images were processed with CAD.

Targeted ultrasound is performed, showing a 3.2 x 1.5 x 3.5 cm
irregular mixed echogenicity mass right breast 10:30 o'clock 1 cm
from the nipple.

There is an additional 0.7 x 0.3 x 0.6 cm irregular hypoechoic mass
right breast 10 o'clock position 3 cm from nipple.

Within the right breast 5 o'clock position 4 cm from nipple there is
a 0.8 x 0.5 x 0.5 cm irregular hypoechoic mass.

No right axillary adenopathy.

Within the left breast 7 o'clock position 1 cm from the nipple there
is a 1.4 x 1.0 x 1.6 cm irregular hypoechoic mass.

Within the left breast 1 o'clock position 3 cm from nipple there is
a 1.5 x 0.6 x 0.8 cm irregular hypoechoic mass.

Within the left breast 10 o'clock position 3 cm from nipple there is
a 7 x 4 x 7 mm irregular hypoechoic mass.

No left axillary adenopathy.
IMPRESSION: Bilateral breast masses concerning for bilateral breast malignancy.

RECOMMENDATION:
Recommend ultrasound-guided core needle biopsy of the dominant right
breast mass 10:30 o'clock 1 cm from nipple.

Recommend ultrasound-guided core needle biopsy left breast mass 7
o'clock position 1 cm from the nipple.

Recommend ultrasound-guided core needle biopsy left breast mass 1
o'clock position 3 cm from nipple.

Recommend bilateral breast MRI after the above biopsies to evaluate
the extent of disease as there multiple additional areas of
shadowing and smaller masses demonstrated within the right and left
breast concerning for extensive disease bilaterally.

I have discussed the findings and recommendations with the patient.
If applicable, a reminder letter will be sent to the patient
regarding the next appointment.

BI-RADS CATEGORY  5: Highly suggestive of malignancy.

## 2019-08-01 IMAGING — MG DIGITAL DIAGNOSTIC BILAT W/ TOMO W/ CAD
6 of 12 series · 6 of 36 positions shown · non-contrast
Comparison: Previous exam(s).

CLINICAL DATA: Patient recalled from screening areas of distortion.

EXAM:
DIGITAL DIAGNOSTIC BILATERAL MAMMOGRAM WITH CAD AND TOMO
ULTRASOUND BILATERAL BREAST

[L ML synth-2D]
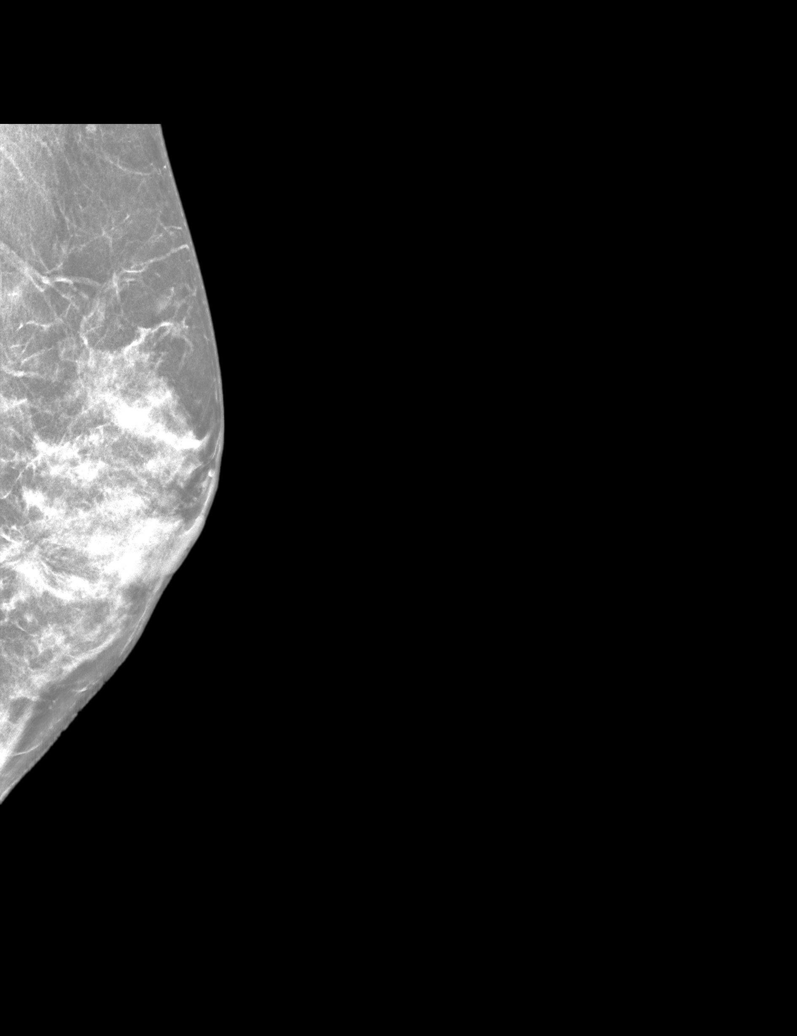

[R ML synth-2D]
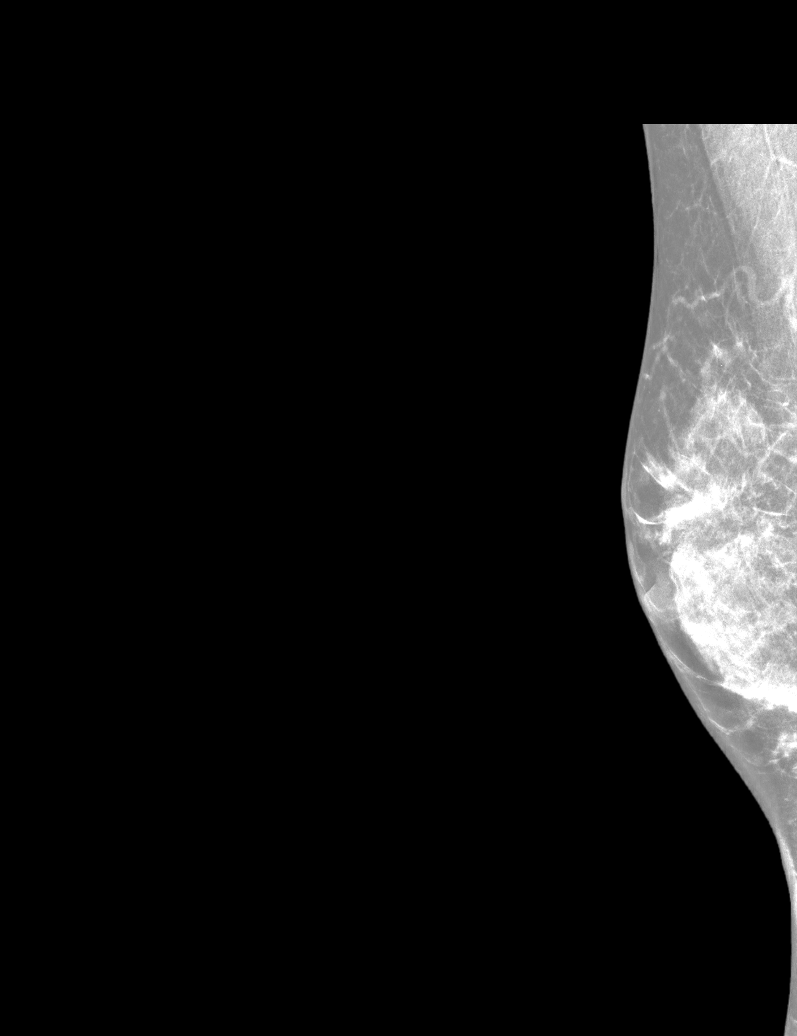

[R CC synth-2D]
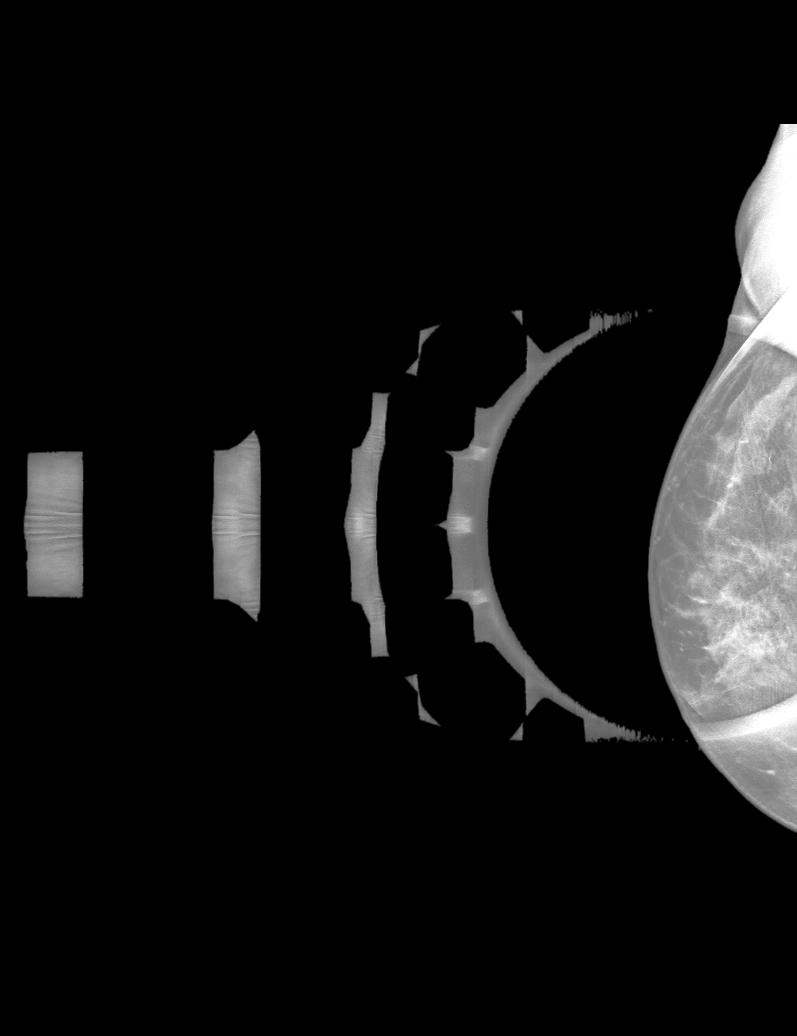

[L CC synth-2D]
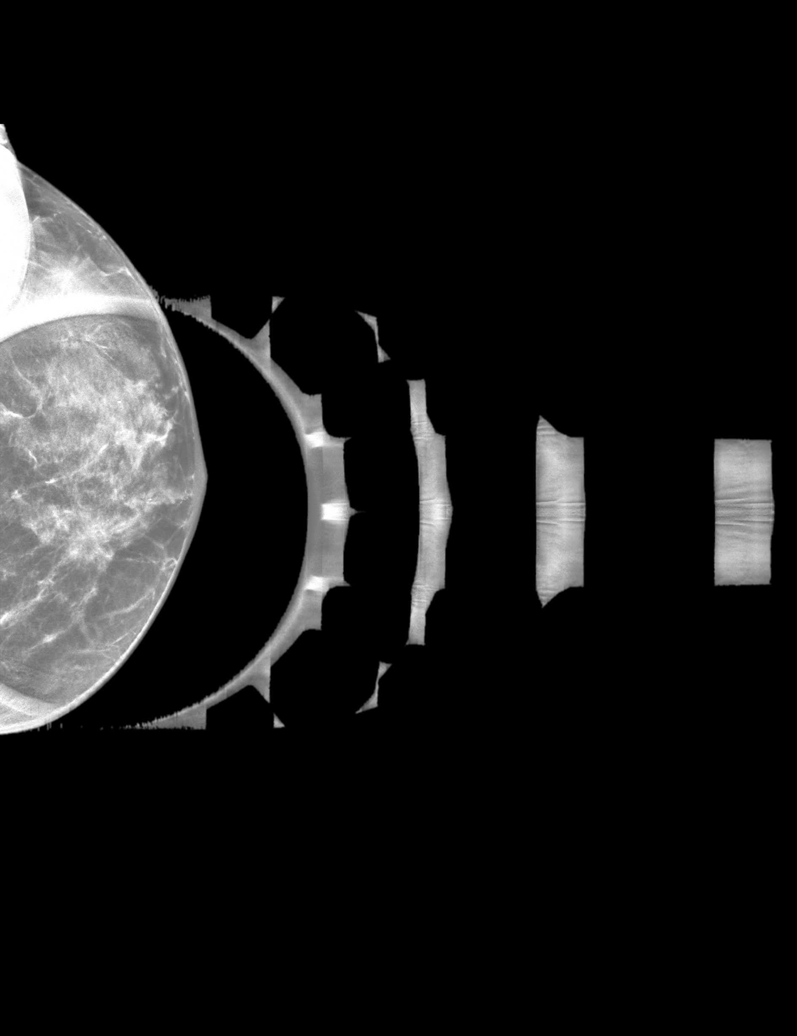

[R MLO synth-2D]
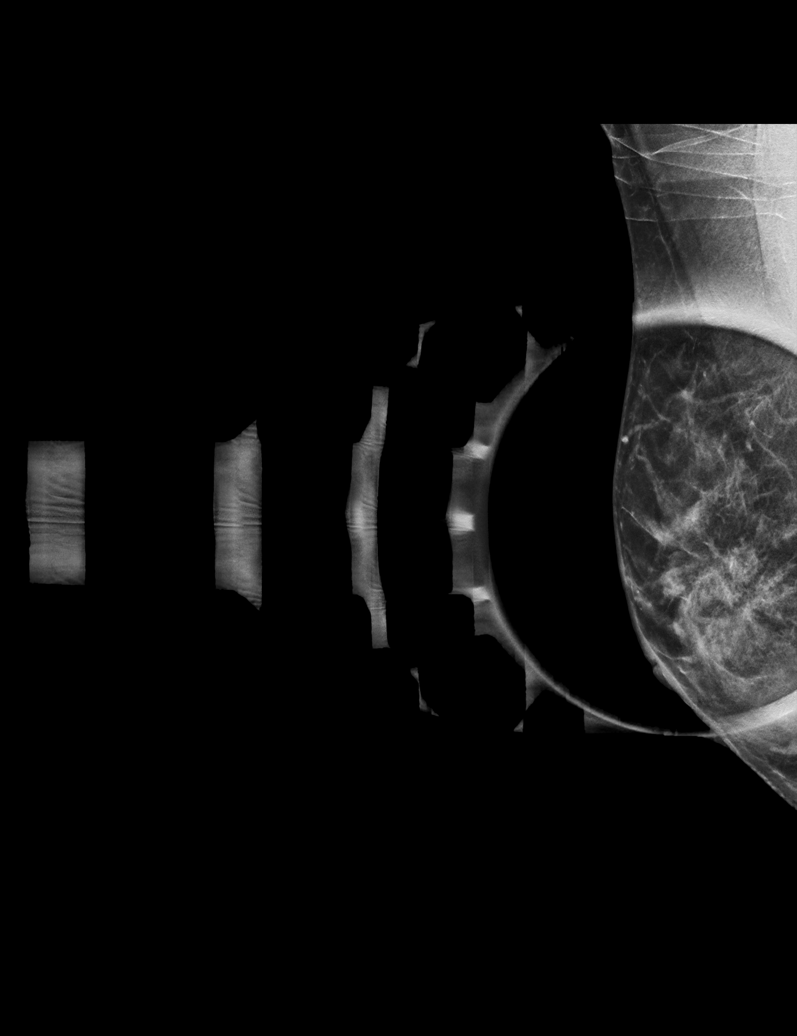

[L MLO synth-2D]
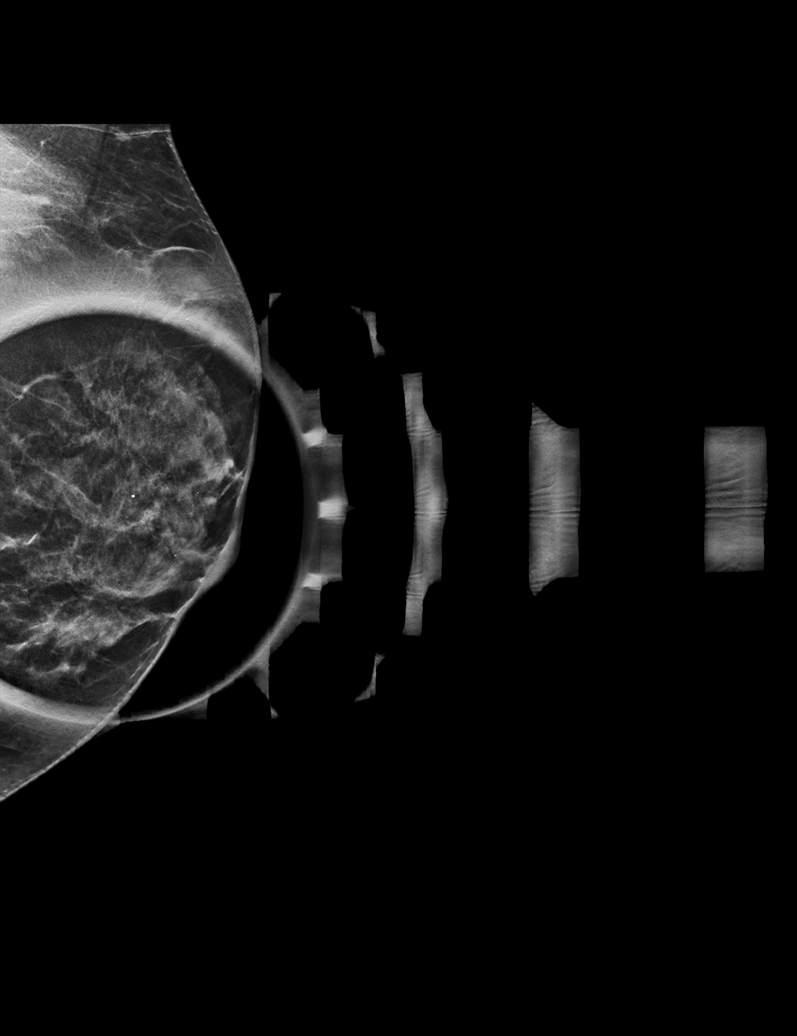

[6 of 36 positions shown; findings below may reference images not displayed]

ACR Breast Density Category c: The breast tissue is heterogeneously
dense, which may obscure small masses.
FINDINGS: Within the superior right breast there is a persistent focal area of
architectural distortion.

There is a persistent focal area of architectural distortion within
the lower inner left breast middle depth. Additionally, there is
suggestion of distortion within the outer left breast anterior and
posterior depth.

Mammographic images were processed with CAD.

Targeted ultrasound is performed, showing a 3.2 x 1.5 x 3.5 cm
irregular mixed echogenicity mass right breast 10:30 o'clock 1 cm
from the nipple.

There is an additional 0.7 x 0.3 x 0.6 cm irregular hypoechoic mass
right breast 10 o'clock position 3 cm from nipple.

Within the right breast 5 o'clock position 4 cm from nipple there is
a 0.8 x 0.5 x 0.5 cm irregular hypoechoic mass.

No right axillary adenopathy.

Within the left breast 7 o'clock position 1 cm from the nipple there
is a 1.4 x 1.0 x 1.6 cm irregular hypoechoic mass.

Within the left breast 1 o'clock position 3 cm from nipple there is
a 1.5 x 0.6 x 0.8 cm irregular hypoechoic mass.

Within the left breast 10 o'clock position 3 cm from nipple there is
a 7 x 4 x 7 mm irregular hypoechoic mass.

No left axillary adenopathy.
IMPRESSION: Bilateral breast masses concerning for bilateral breast malignancy.

RECOMMENDATION:
Recommend ultrasound-guided core needle biopsy of the dominant right
breast mass 10:30 o'clock 1 cm from nipple.

Recommend ultrasound-guided core needle biopsy left breast mass 7
o'clock position 1 cm from the nipple.

Recommend ultrasound-guided core needle biopsy left breast mass 1
o'clock position 3 cm from nipple.

Recommend bilateral breast MRI after the above biopsies to evaluate
the extent of disease as there multiple additional areas of
shadowing and smaller masses demonstrated within the right and left
breast concerning for extensive disease bilaterally.

I have discussed the findings and recommendations with the patient.
If applicable, a reminder letter will be sent to the patient
regarding the next appointment.

BI-RADS CATEGORY  5: Highly suggestive of malignancy.

## 2019-08-14 ENCOUNTER — Ambulatory Visit
Admission: RE | Admit: 2019-08-14 | Discharge: 2019-08-14 | Disposition: A | Discharge status: Home / Self Care | Payer: BC Managed Care – PPO | Source: Ambulatory Visit | Attending: Obstetrics and Gynecology | Admitting: Obstetrics and Gynecology

## 2019-08-14 ENCOUNTER — Other Ambulatory Visit: Payer: Self-pay

## 2019-08-14 DIAGNOSIS — R928 Other abnormal and inconclusive findings on diagnostic imaging of breast: Secondary | ICD-10-CM

## 2019-08-14 HISTORY — PX: BREAST BIOPSY: SHX20

## 2019-08-14 IMAGING — MG MM BREAST LOCALIZATION CLIP
4 series · 4 of 12 positions shown · non-contrast
Comparison: Previous exam(s).

CLINICAL DATA: Post biopsy mammogram of the bilateral breasts for
clip placement.

EXAM:
DIAGNOSTIC BILATERAL MAMMOGRAM POST ULTRASOUND BIOPSY

[R MLO synth-2D]
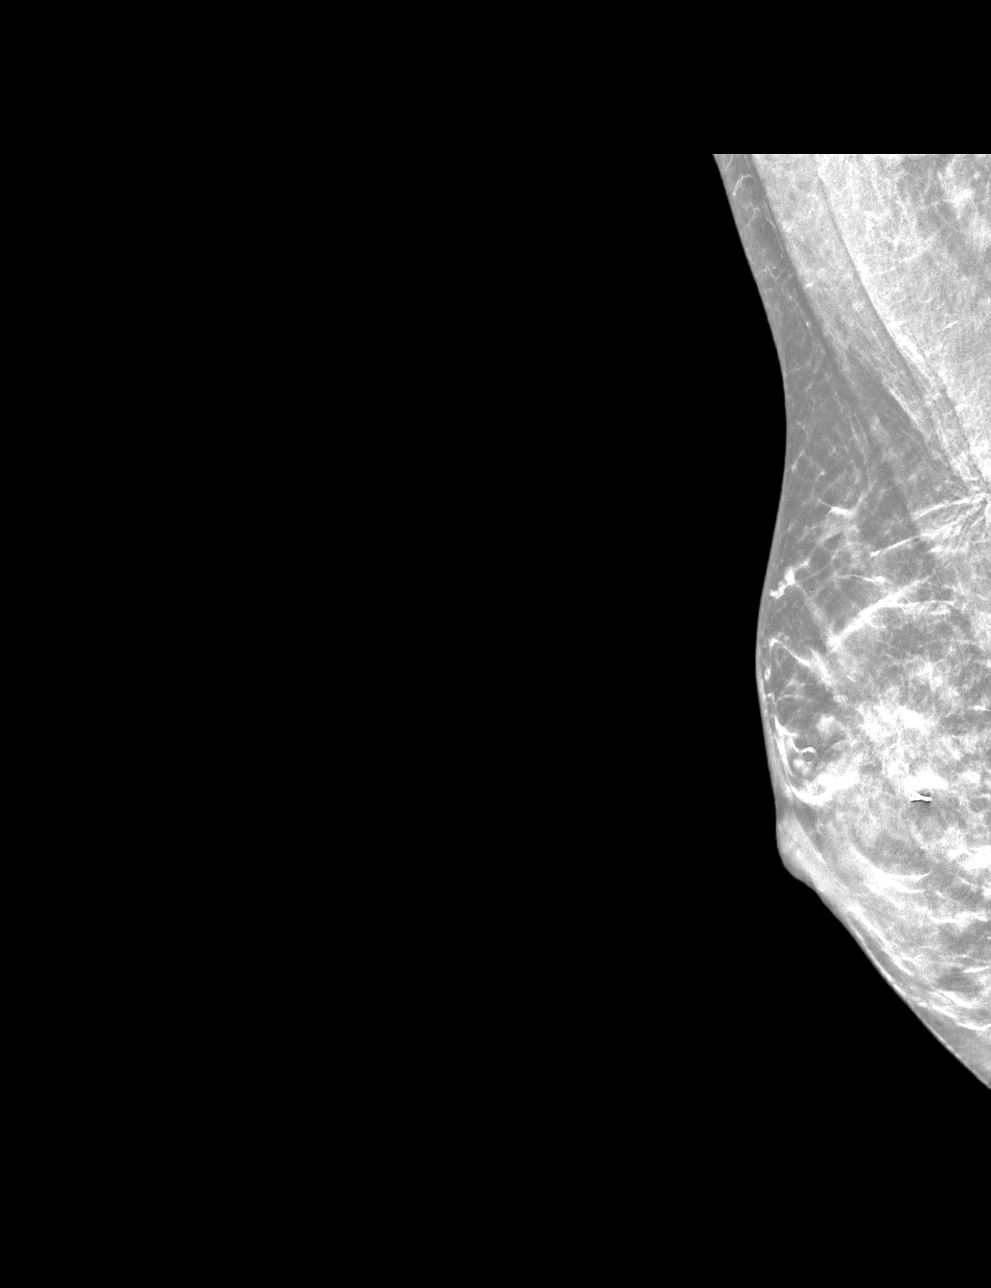

[R CC synth-2D]
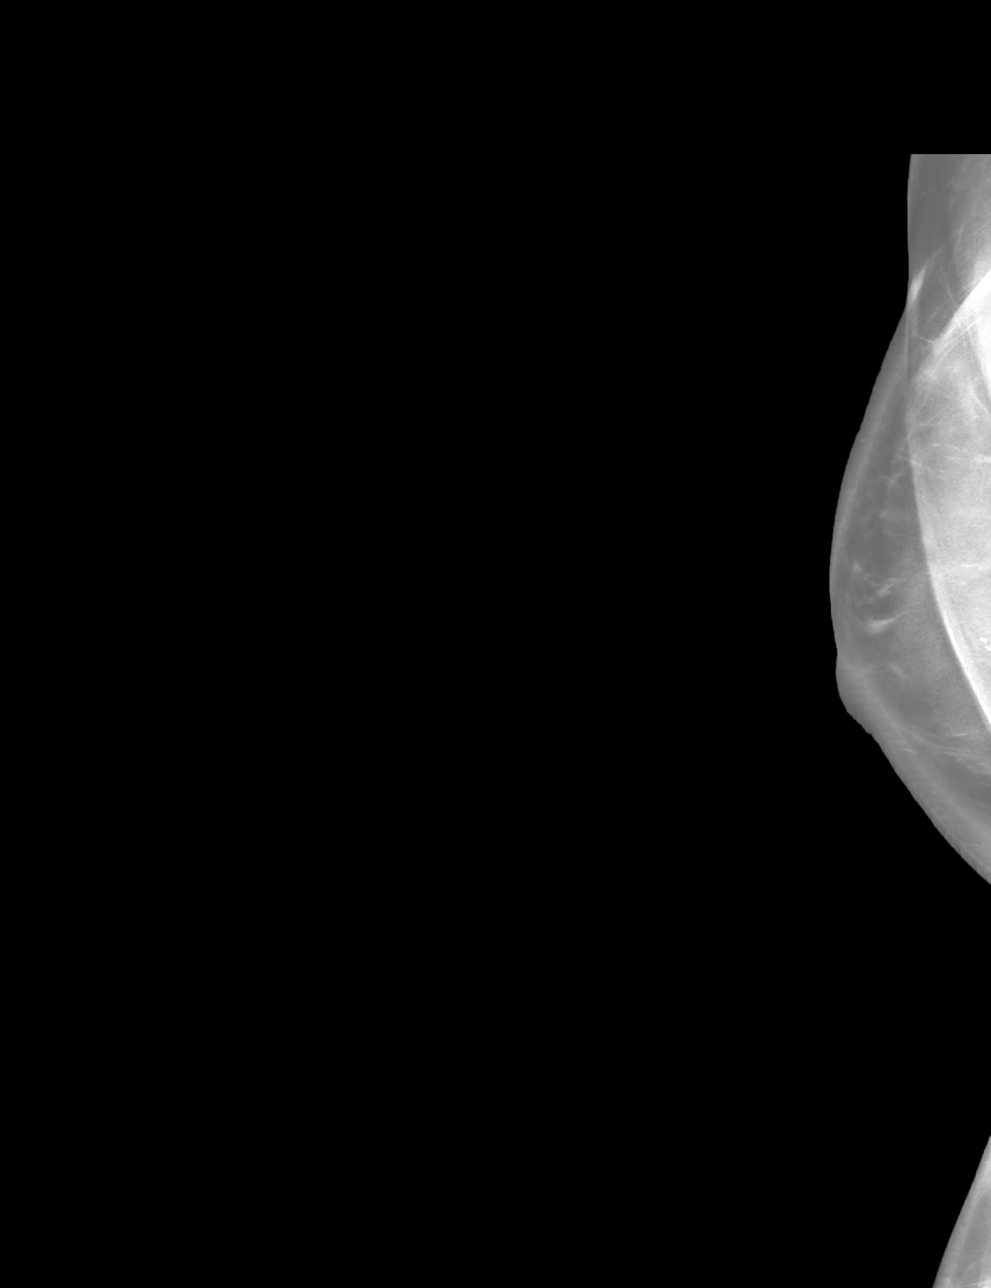

[R MLO tomo · tomo slice 23/45.0]
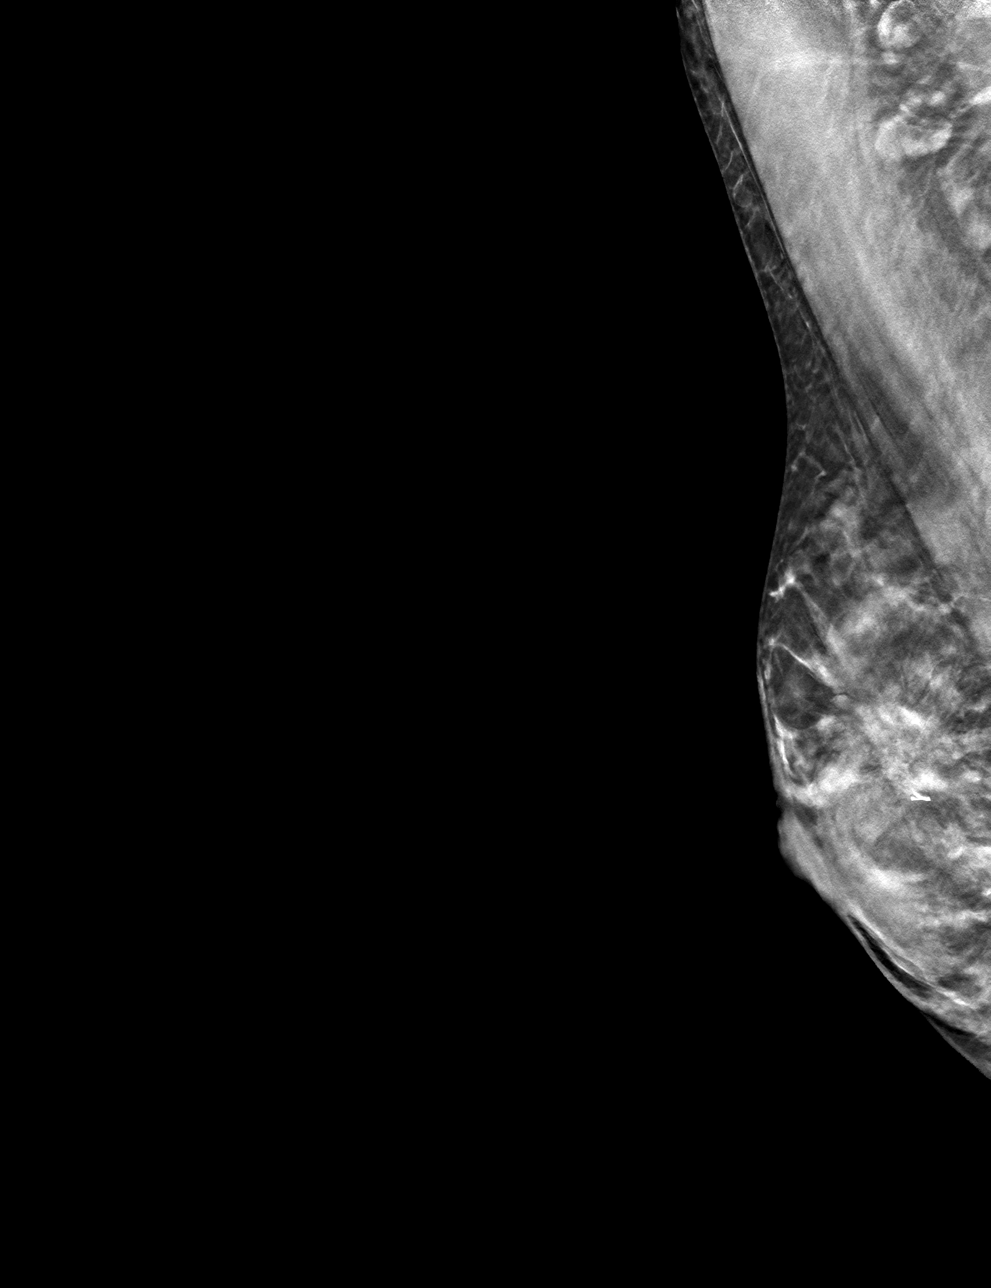

[R CC tomo · tomo slice 41/82.0]
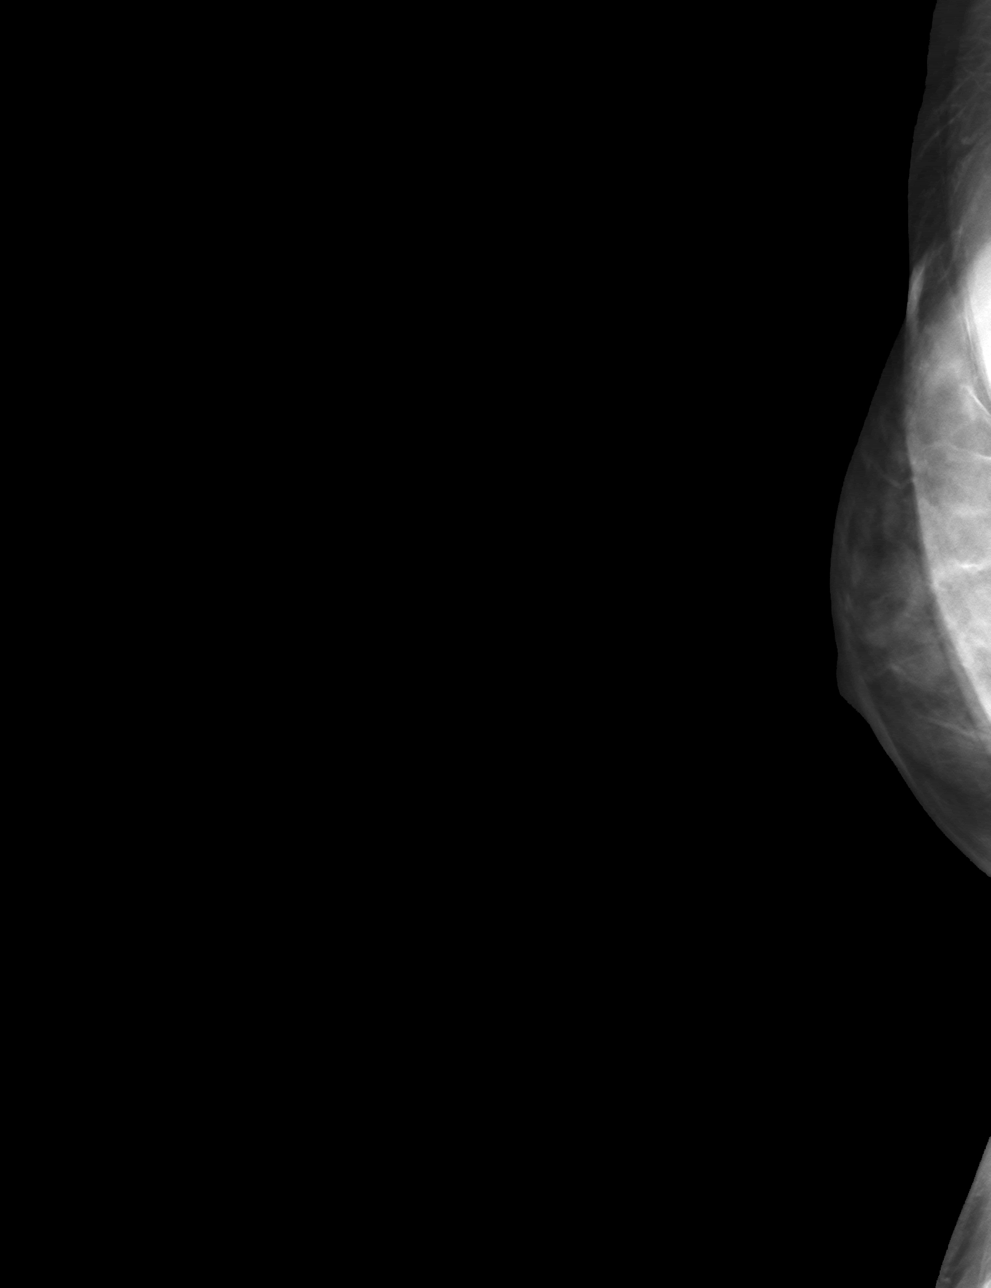

[4 of 12 positions shown; findings below may reference images not displayed]

FINDINGS: Mammographic images were obtained following ultrasound guided biopsy
of the bilateral breasts. The biopsy marking clips are in expected
position at the site of biopsy.
IMPRESSION: 1. Appropriate positioning of the ribbon shaped biopsy marking clip
at the site of biopsy in the upper slightly outer right breast.

2. Appropriate positioning of the ribbon shaped biopsy marking clip
at the site of biopsy in the lower-inner left breast.

3. Appropriate positioning of the coil shaped biopsy marking clip at
the site of biopsy in the upper-outer left breast.

Final Assessment: Post Procedure Mammograms for Marker Placement

## 2019-08-14 IMAGING — US US  BREAST BX W/ LOC DEV 1ST LESION IMG BX SPEC US GUIDE*R*
1 series · 10 of 10 positions shown · non-contrast
Comparison: Previous exam(s).
COMPARISON: Previous exam(s).

Addendum:
CLINICAL DATA: 49-year-old female presenting for ultrasound-guided
biopsy of bilateral breast masses.

PROCEDURE:
ULTRASOUND GUIDED BILATERAL BREAST CORE NEEDLE BIOPSY

[Series 1: us breast bx w/ loc dev 1st lesion img bx spec us  · 0.06mm/px · 10 of 10 slices shown]
[im 1/10]
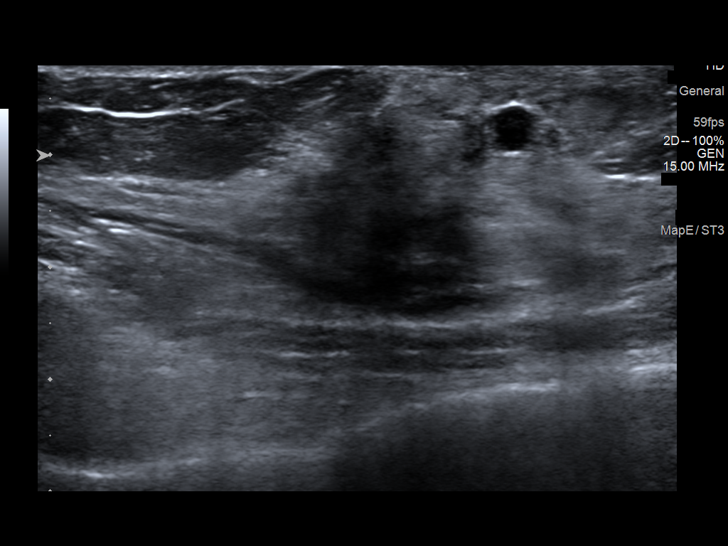
[im 2/10]
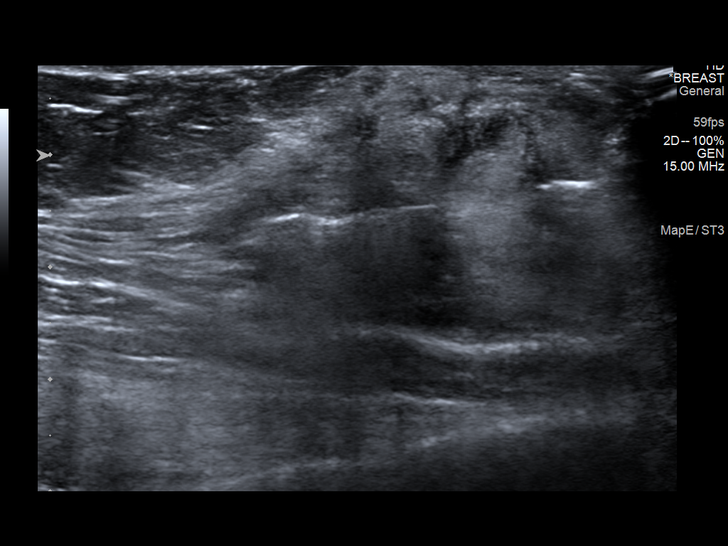
[im 3/10]
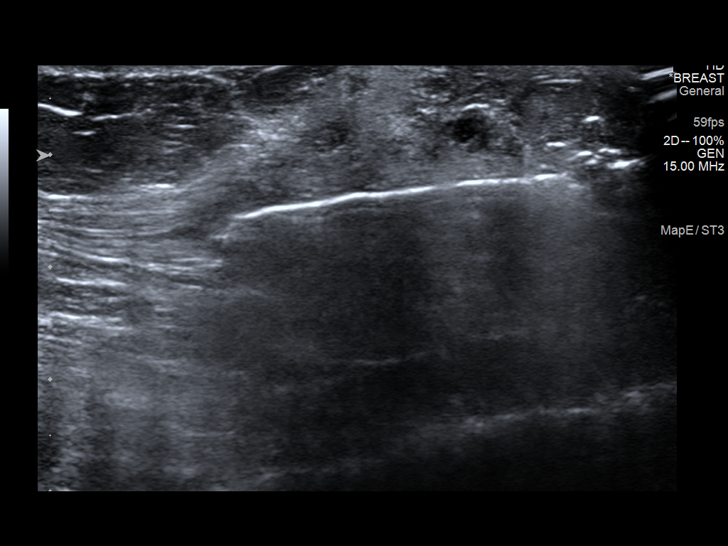
[im 4/10]
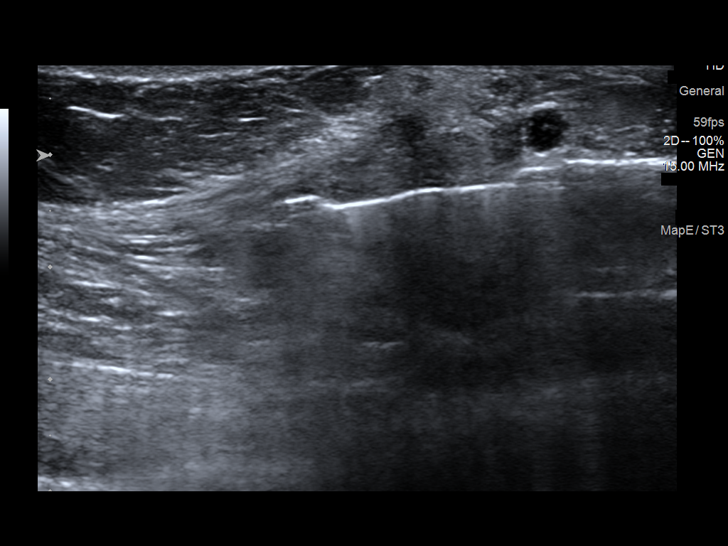
[im 5/10]
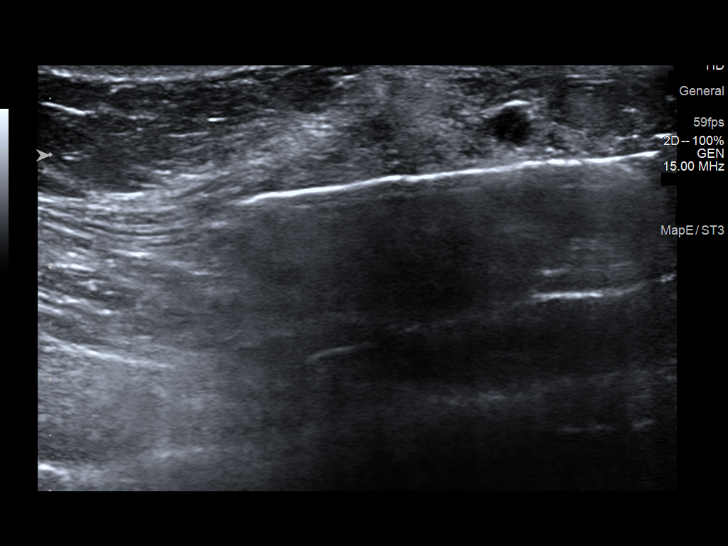
[im 6/10]
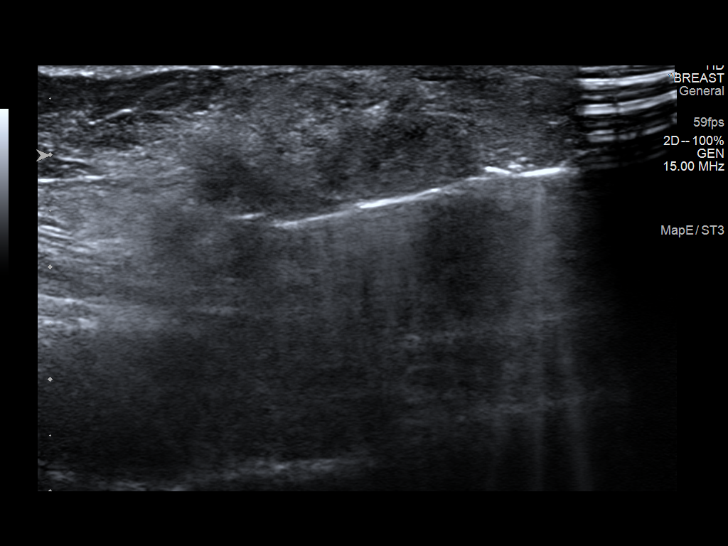
[im 7/10]
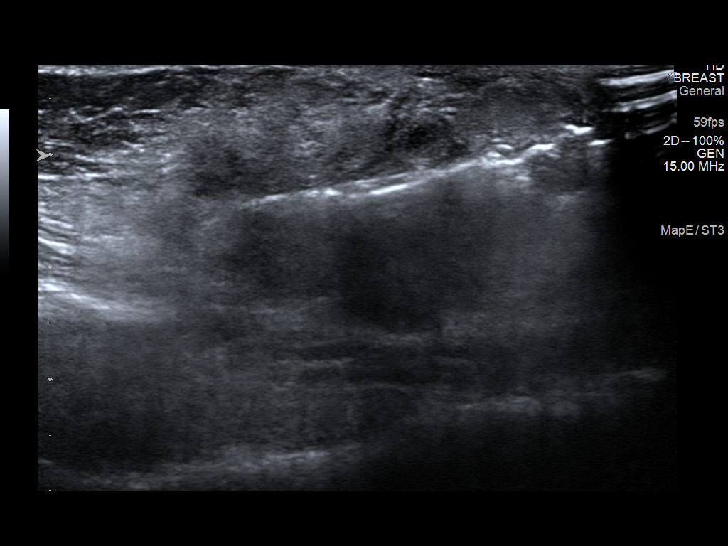
[im 8/10]
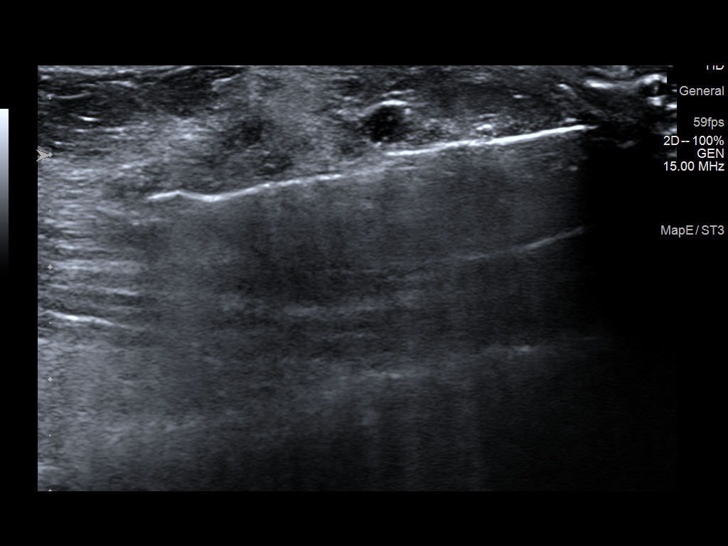
[im 9/10]
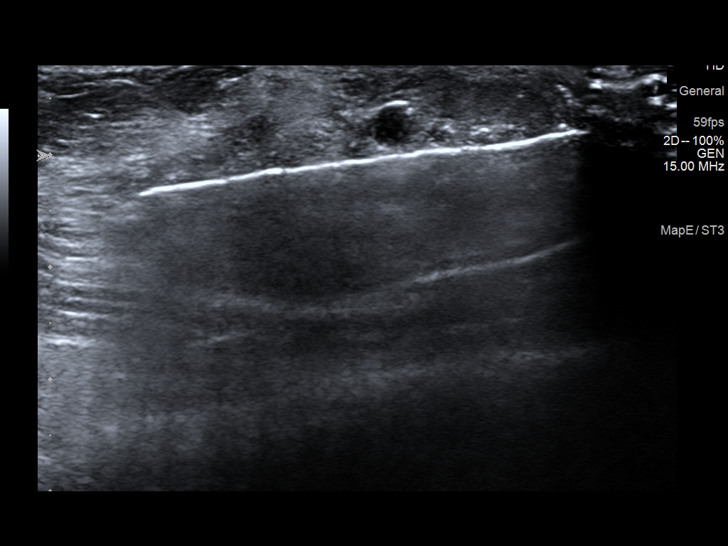
[im 10/10]
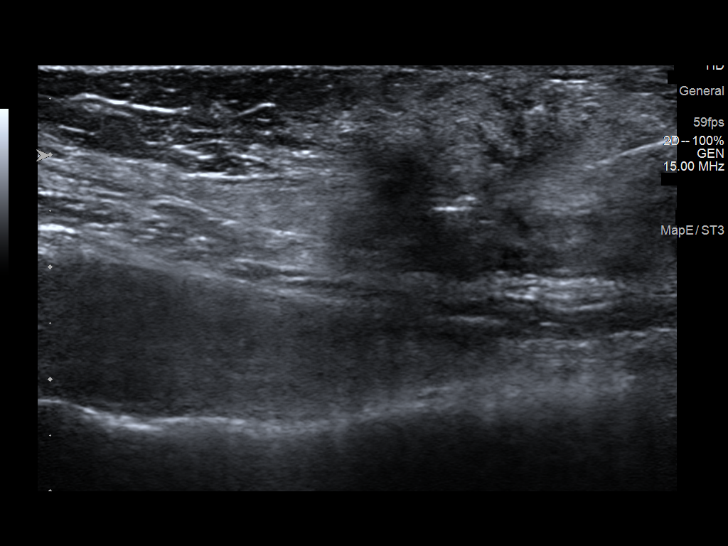

[10 of 10 positions shown; findings below may reference images not displayed]



#1 Lesion quadrant: Upper outer quadrant

Using sterile technique and 1% Lidocaine as local anesthetic, under
direct ultrasound visualization, a 14 gauge JAILTON device was
used to perform biopsy of a mass in the right breast at [DATE] using
a medial approach. At the conclusion of the procedure a ribbon
shaped tissue marker clip was deployed into the biopsy cavity.

--------------------------------------------------------------------------------------------------------------------------------------------

#2 Lesion quadrant: Lower inner quadrant

Using sterile technique and 1% Lidocaine as local anesthetic, under
direct ultrasound visualization, a 14 gauge JAILTON device was
used to perform biopsy of a mass in the left breast at 7 o'clock
using a superolateral approach. At the conclusion of the procedure a
ribbon shaped tissue marker clip was deployed into the biopsy
cavity.

--------------------------------------------------------------------------------------------------------------------------------------------

#3 Lesion quadrant: Upper outer quadrant

Using sterile technique and 1% Lidocaine as local anesthetic, under
direct ultrasound visualization, a 14 gauge JAILTON device was
used to perform biopsy of a mass in the left breast at 1 o'clock
using a lateral approach. At the conclusion of the procedure a coil
shaped tissue marker clip was deployed into the biopsy cavity.

Follow up 2 view mammogram was performed and dictated separately.
IMPRESSION: 1. Ultrasound guided biopsy of a right breast mass at [DATE]. No
apparent complications.

2. Ultrasound guided biopsy of a left breast mass at 7 o'clock. No
apparent complications.

3. Ultrasound guided biopsy of a left breast mass at 1 o'clock. No
apparent complications.

ADDENDUM:
Pathology revealed COMPLEX SCLEROSING LESION WITH USUAL DUCTAL
HYPERPLASIA of the RIGHT breast, 10:30 o'clock. This was found to be
concordant by Dr. JAILTON, with excision recommended.

Pathology revealed COMPLEX SCLEROSING LESION WITH USUAL DUCTAL
HYPERPLASIA of the LEFT breast, 7 o'clock. This was found to be
concordant by Dr. JAILTON, with excision recommended.

Pathology revealed COMPLEX SCLEROSING LESION WITH USUAL DUCTAL
HYPERPLASIA of the LEFT breast, 1 o'clock. This was found to be
concordant by Dr. JAILTON, with excision recommended.

Pathology results were discussed with the patient by telephone. The
patient reported doing well after the biopsies with tenderness at
the sites. Post biopsy instructions and care were reviewed and
questions were answered. The patient was encouraged to call The

Per patient request, surgical consultation has been arranged with
Dr. JAILTON at [REDACTED] on [DATE].

Given bilateral high risk lesions, MRI is recommended. There are
other suspicious masses bilaterally which may need biopsy depending
on MRI results and the patient's surgical preference.

Pathology results reported by JAILTON RN on [DATE].



#1 Lesion quadrant: Upper outer quadrant

Using sterile technique and 1% Lidocaine as local anesthetic, under
direct ultrasound visualization, a 14 gauge JAILTON device was
used to perform biopsy of a mass in the right breast at [DATE] using
a medial approach. At the conclusion of the procedure a ribbon
shaped tissue marker clip was deployed into the biopsy cavity.

--------------------------------------------------------------------------------------------------------------------------------------------

#2 Lesion quadrant: Lower inner quadrant

Using sterile technique and 1% Lidocaine as local anesthetic, under
direct ultrasound visualization, a 14 gauge JAILTON device was
used to perform biopsy of a mass in the left breast at 7 o'clock
using a superolateral approach. At the conclusion of the procedure a
ribbon shaped tissue marker clip was deployed into the biopsy
cavity.

--------------------------------------------------------------------------------------------------------------------------------------------

#3 Lesion quadrant: Upper outer quadrant

Using sterile technique and 1% Lidocaine as local anesthetic, under
direct ultrasound visualization, a 14 gauge JAILTON device was
used to perform biopsy of a mass in the left breast at 1 o'clock
using a lateral approach. At the conclusion of the procedure a coil
shaped tissue marker clip was deployed into the biopsy cavity.

Follow up 2 view mammogram was performed and dictated separately.
IMPRESSION: 1. Ultrasound guided biopsy of a right breast mass at [DATE]. No
apparent complications.

2. Ultrasound guided biopsy of a left breast mass at 7 o'clock. No
apparent complications.

3. Ultrasound guided biopsy of a left breast mass at 1 o'clock. No
apparent complications.

## 2019-08-14 IMAGING — US US BREAST BX W LOC DEV 1ST LESION IMG BX SPEC US GUIDE*L*
1 series · 15 of 20 positions shown · non-contrast
Comparison: Previous exam(s).
COMPARISON: Previous exam(s).

Addendum:
CLINICAL DATA: 49-year-old female presenting for ultrasound-guided
biopsy of bilateral breast masses.

PROCEDURE:
ULTRASOUND GUIDED BILATERAL BREAST CORE NEEDLE BIOPSY

[Series 1: us breast bx w loc dev 1st lesion img bx spec us g · 0.06mm/px · 15 of 20 slices shown]
[im 1/20]
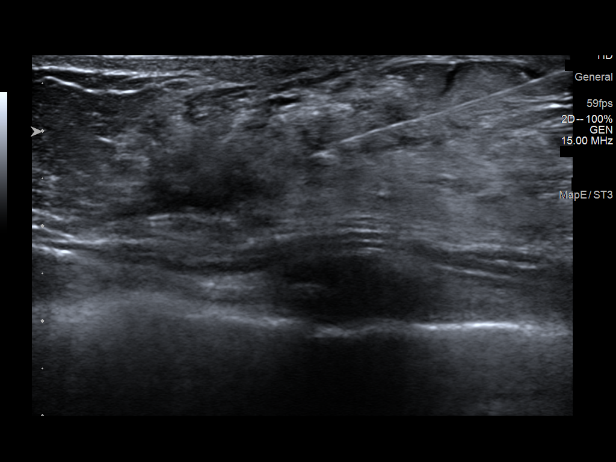
[im 3/20]
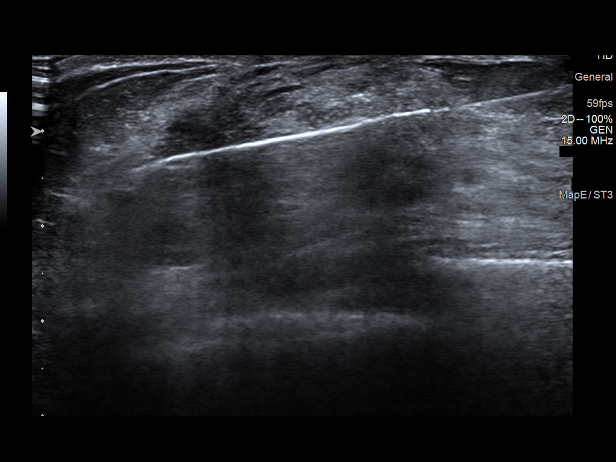
[im 4/20]
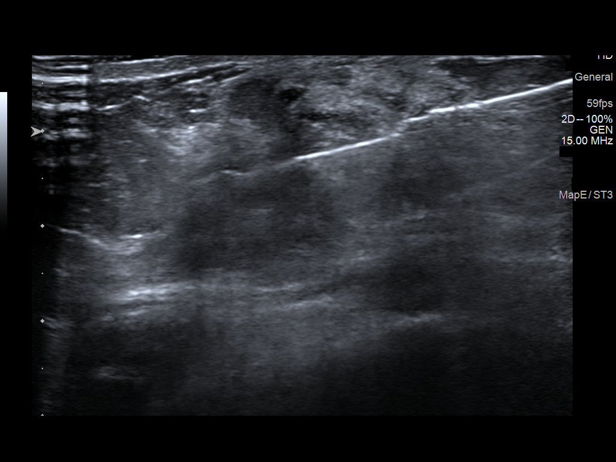
[im 5/20]
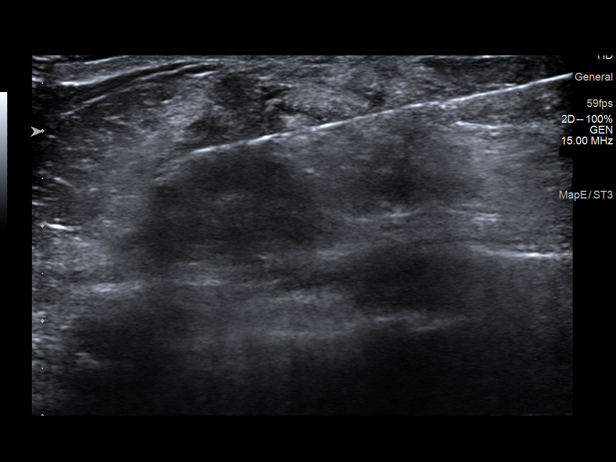
[im 7/20]
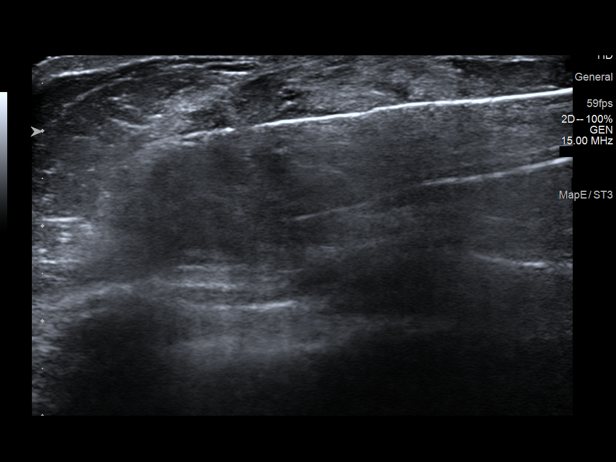
[im 8/20]
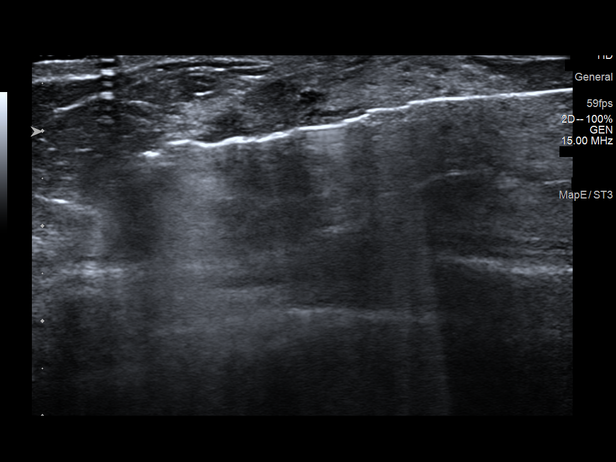
[im 9/20]
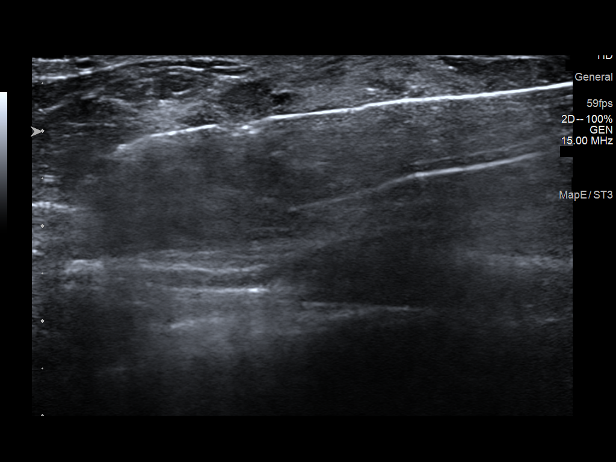
[im 11/20]
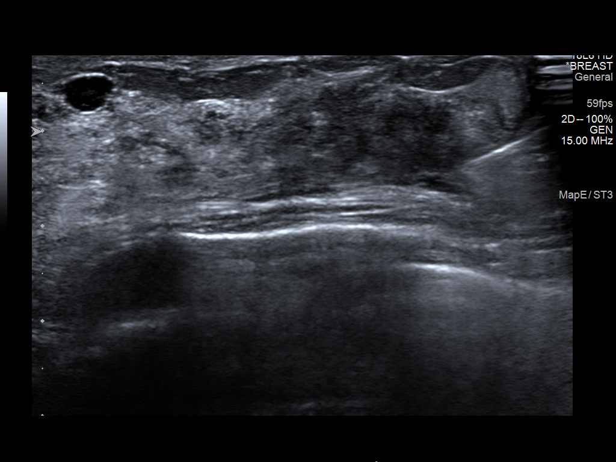
[im 12/20]
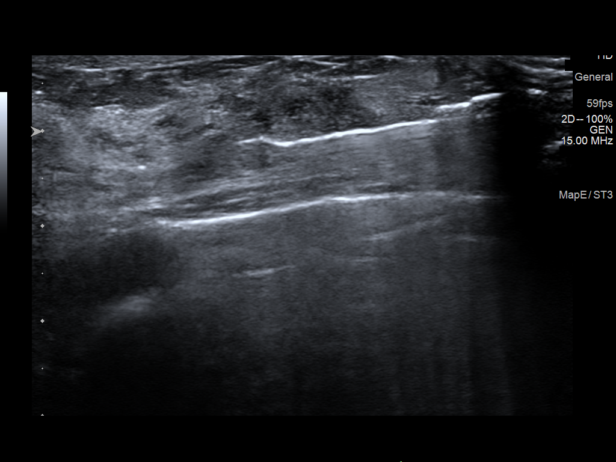
[im 13/20]
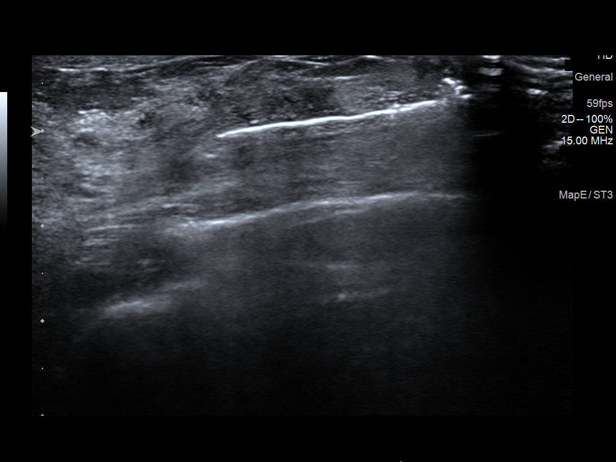
[im 15/20]
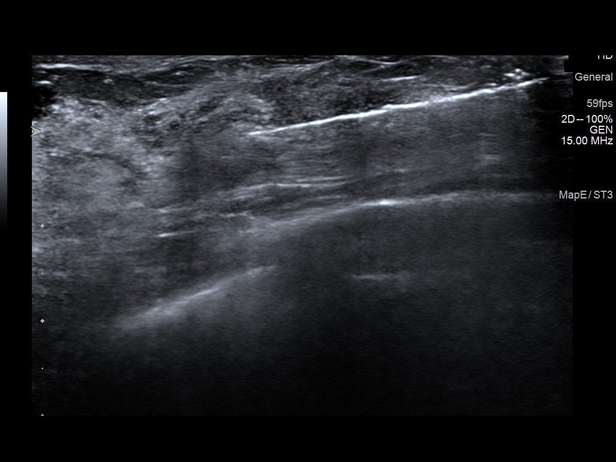
[im 16/20]
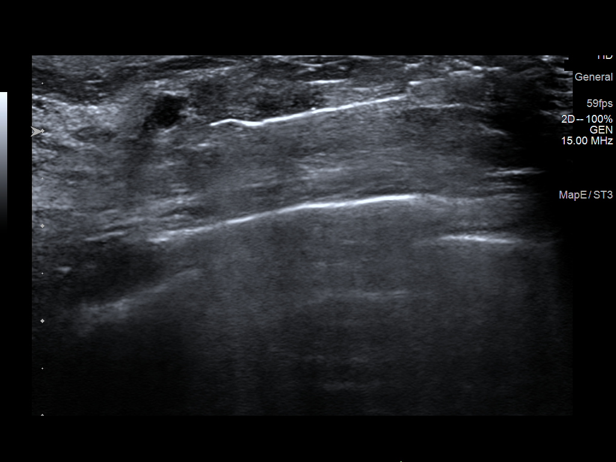
[im 17/20]
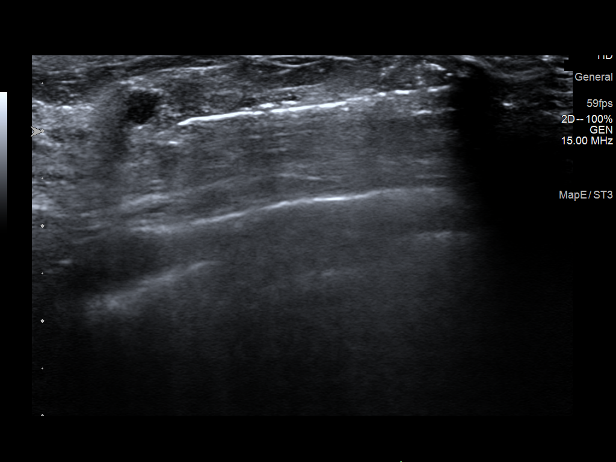
[im 19/20]
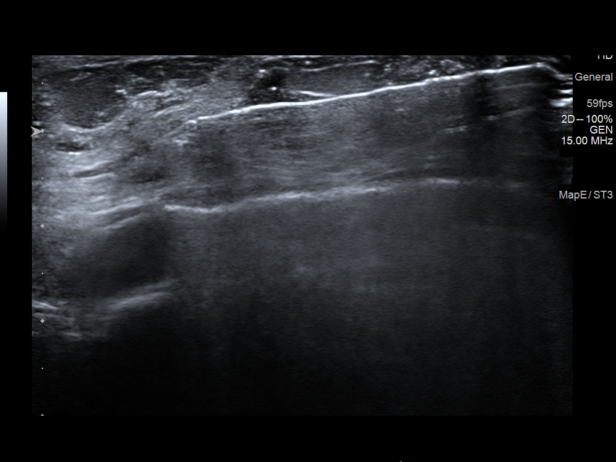
[im 20/20]
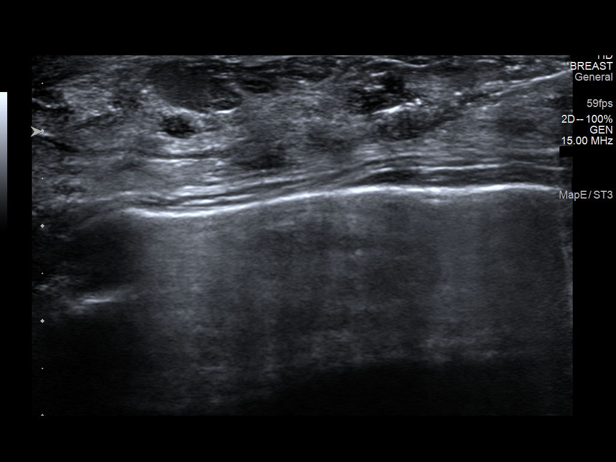

[15 of 20 positions shown; findings below may reference images not displayed]



#1 Lesion quadrant: Upper outer quadrant

Using sterile technique and 1% Lidocaine as local anesthetic, under
direct ultrasound visualization, a 14 gauge JAILTON device was
used to perform biopsy of a mass in the right breast at [DATE] using
a medial approach. At the conclusion of the procedure a ribbon
shaped tissue marker clip was deployed into the biopsy cavity.

--------------------------------------------------------------------------------------------------------------------------------------------

#2 Lesion quadrant: Lower inner quadrant

Using sterile technique and 1% Lidocaine as local anesthetic, under
direct ultrasound visualization, a 14 gauge JAILTON device was
used to perform biopsy of a mass in the left breast at 7 o'clock
using a superolateral approach. At the conclusion of the procedure a
ribbon shaped tissue marker clip was deployed into the biopsy
cavity.

--------------------------------------------------------------------------------------------------------------------------------------------

#3 Lesion quadrant: Upper outer quadrant

Using sterile technique and 1% Lidocaine as local anesthetic, under
direct ultrasound visualization, a 14 gauge JAILTON device was
used to perform biopsy of a mass in the left breast at 1 o'clock
using a lateral approach. At the conclusion of the procedure a coil
shaped tissue marker clip was deployed into the biopsy cavity.

Follow up 2 view mammogram was performed and dictated separately.
IMPRESSION: 1. Ultrasound guided biopsy of a right breast mass at [DATE]. No
apparent complications.

2. Ultrasound guided biopsy of a left breast mass at 7 o'clock. No
apparent complications.

3. Ultrasound guided biopsy of a left breast mass at 1 o'clock. No
apparent complications.

ADDENDUM:
Pathology revealed COMPLEX SCLEROSING LESION WITH USUAL DUCTAL
HYPERPLASIA of the RIGHT breast, 10:30 o'clock. This was found to be
concordant by Dr. JAILTON, with excision recommended.

Pathology revealed COMPLEX SCLEROSING LESION WITH USUAL DUCTAL
HYPERPLASIA of the LEFT breast, 7 o'clock. This was found to be
concordant by Dr. JAILTON, with excision recommended.

Pathology revealed COMPLEX SCLEROSING LESION WITH USUAL DUCTAL
HYPERPLASIA of the LEFT breast, 1 o'clock. This was found to be
concordant by Dr. JAILTON, with excision recommended.

Pathology results were discussed with the patient by telephone. The
patient reported doing well after the biopsies with tenderness at
the sites. Post biopsy instructions and care were reviewed and
questions were answered. The patient was encouraged to call The

Per patient request, surgical consultation has been arranged with
Dr. JAILTON at [REDACTED] on [DATE].

Given bilateral high risk lesions, MRI is recommended. There are
other suspicious masses bilaterally which may need biopsy depending
on MRI results and the patient's surgical preference.

Pathology results reported by JAILTON RN on [DATE].



#1 Lesion quadrant: Upper outer quadrant

Using sterile technique and 1% Lidocaine as local anesthetic, under
direct ultrasound visualization, a 14 gauge JAILTON device was
used to perform biopsy of a mass in the right breast at [DATE] using
a medial approach. At the conclusion of the procedure a ribbon
shaped tissue marker clip was deployed into the biopsy cavity.

--------------------------------------------------------------------------------------------------------------------------------------------

#2 Lesion quadrant: Lower inner quadrant

Using sterile technique and 1% Lidocaine as local anesthetic, under
direct ultrasound visualization, a 14 gauge JAILTON device was
used to perform biopsy of a mass in the left breast at 7 o'clock
using a superolateral approach. At the conclusion of the procedure a
ribbon shaped tissue marker clip was deployed into the biopsy
cavity.

--------------------------------------------------------------------------------------------------------------------------------------------

#3 Lesion quadrant: Upper outer quadrant

Using sterile technique and 1% Lidocaine as local anesthetic, under
direct ultrasound visualization, a 14 gauge JAILTON device was
used to perform biopsy of a mass in the left breast at 1 o'clock
using a lateral approach. At the conclusion of the procedure a coil
shaped tissue marker clip was deployed into the biopsy cavity.

Follow up 2 view mammogram was performed and dictated separately.
IMPRESSION: 1. Ultrasound guided biopsy of a right breast mass at [DATE]. No
apparent complications.

2. Ultrasound guided biopsy of a left breast mass at 7 o'clock. No
apparent complications.

3. Ultrasound guided biopsy of a left breast mass at 1 o'clock. No
apparent complications.

## 2019-08-14 IMAGING — MG MM BREAST LOCALIZATION CLIP
4 series · 4 of 12 positions shown · non-contrast
Comparison: Previous exam(s).

CLINICAL DATA: Post biopsy mammogram of the bilateral breasts for
clip placement.

EXAM:
DIAGNOSTIC BILATERAL MAMMOGRAM POST ULTRASOUND BIOPSY

[L CC synth-2D]
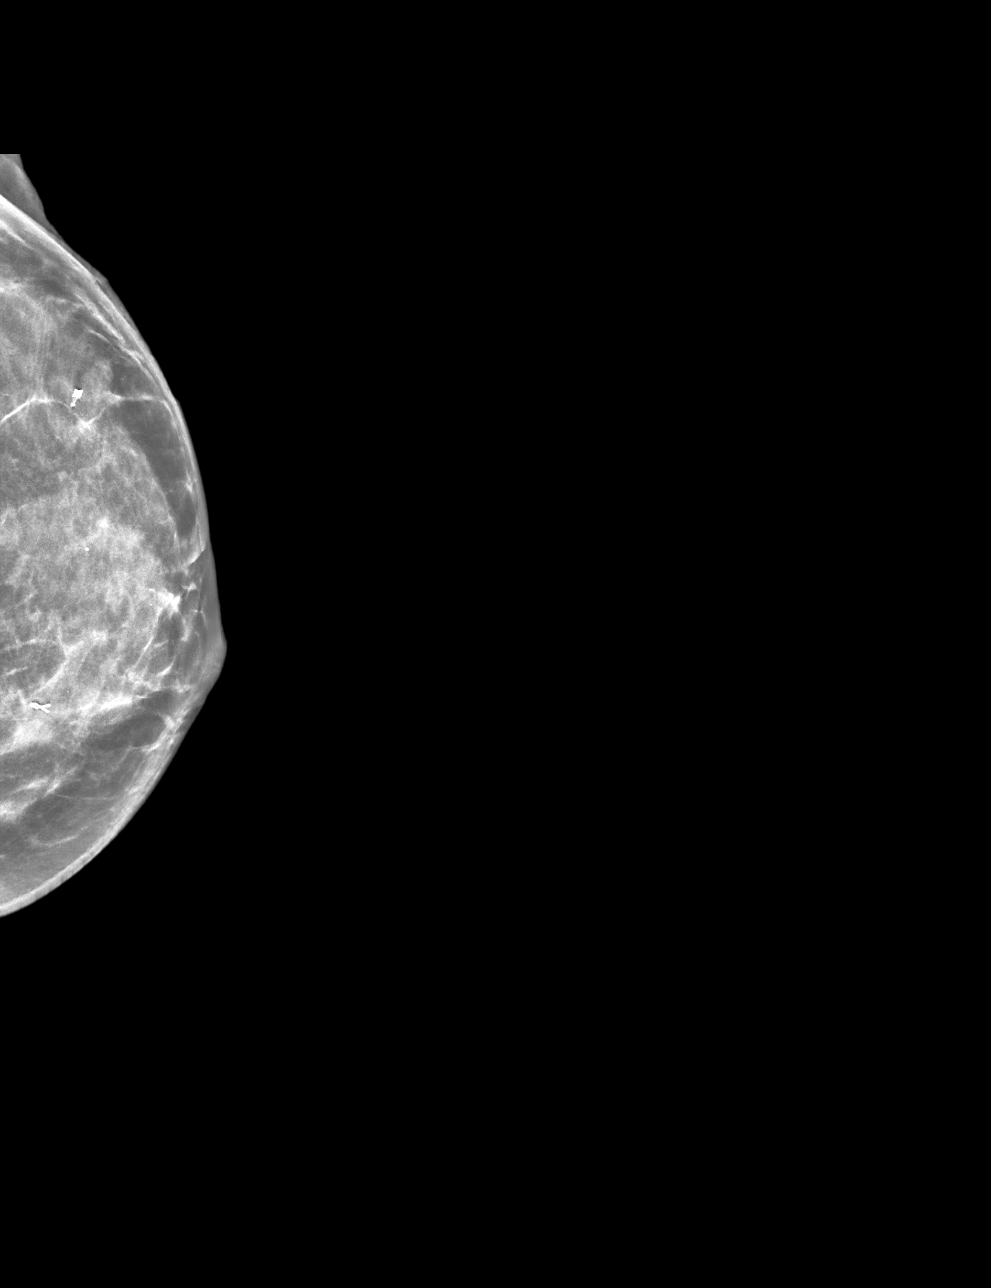

[L MLO synth-2D]
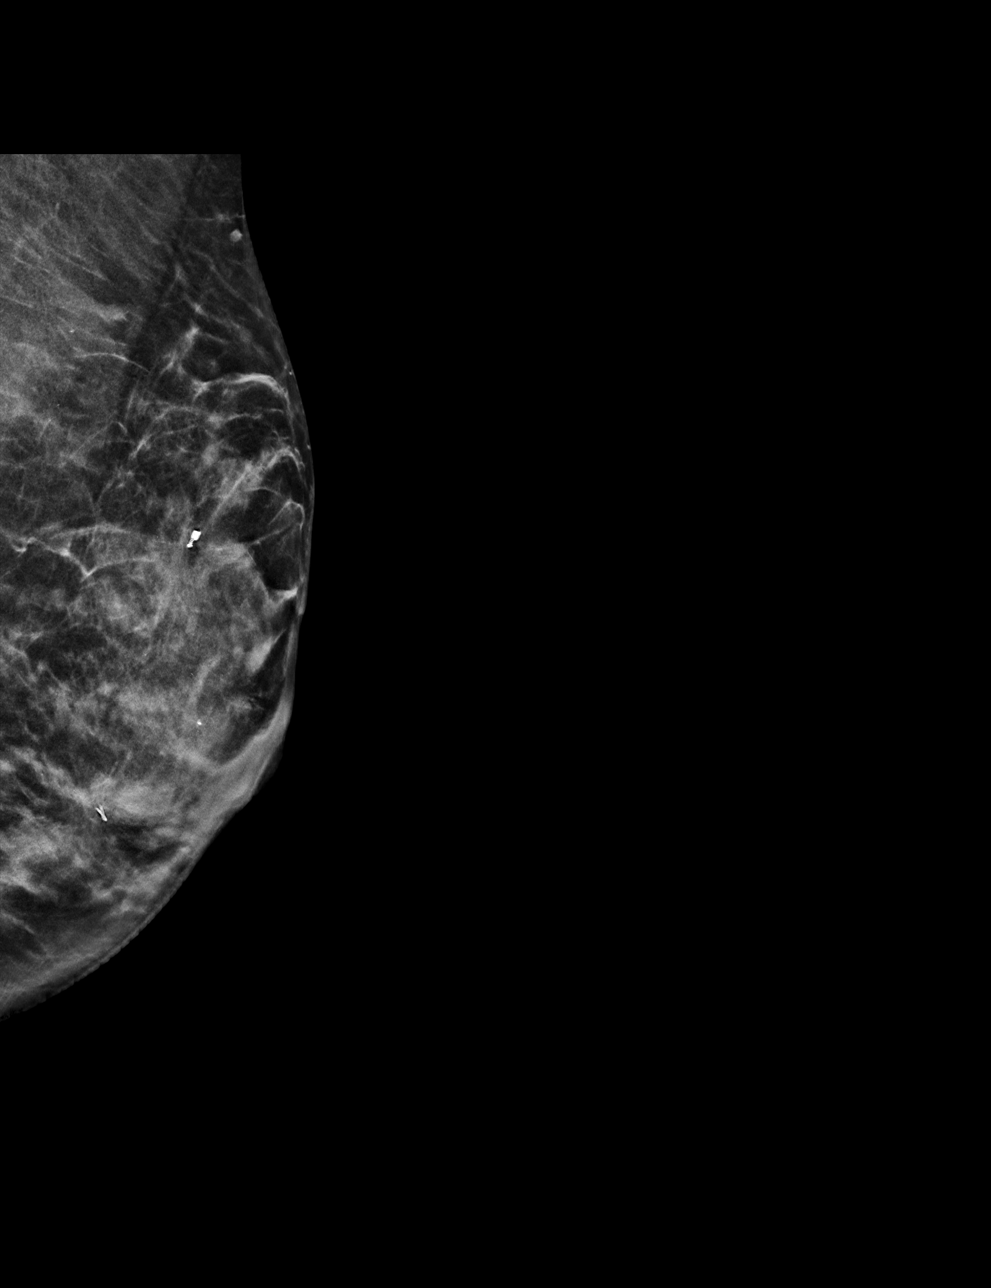

[L CC tomo · tomo slice 27/52.0]
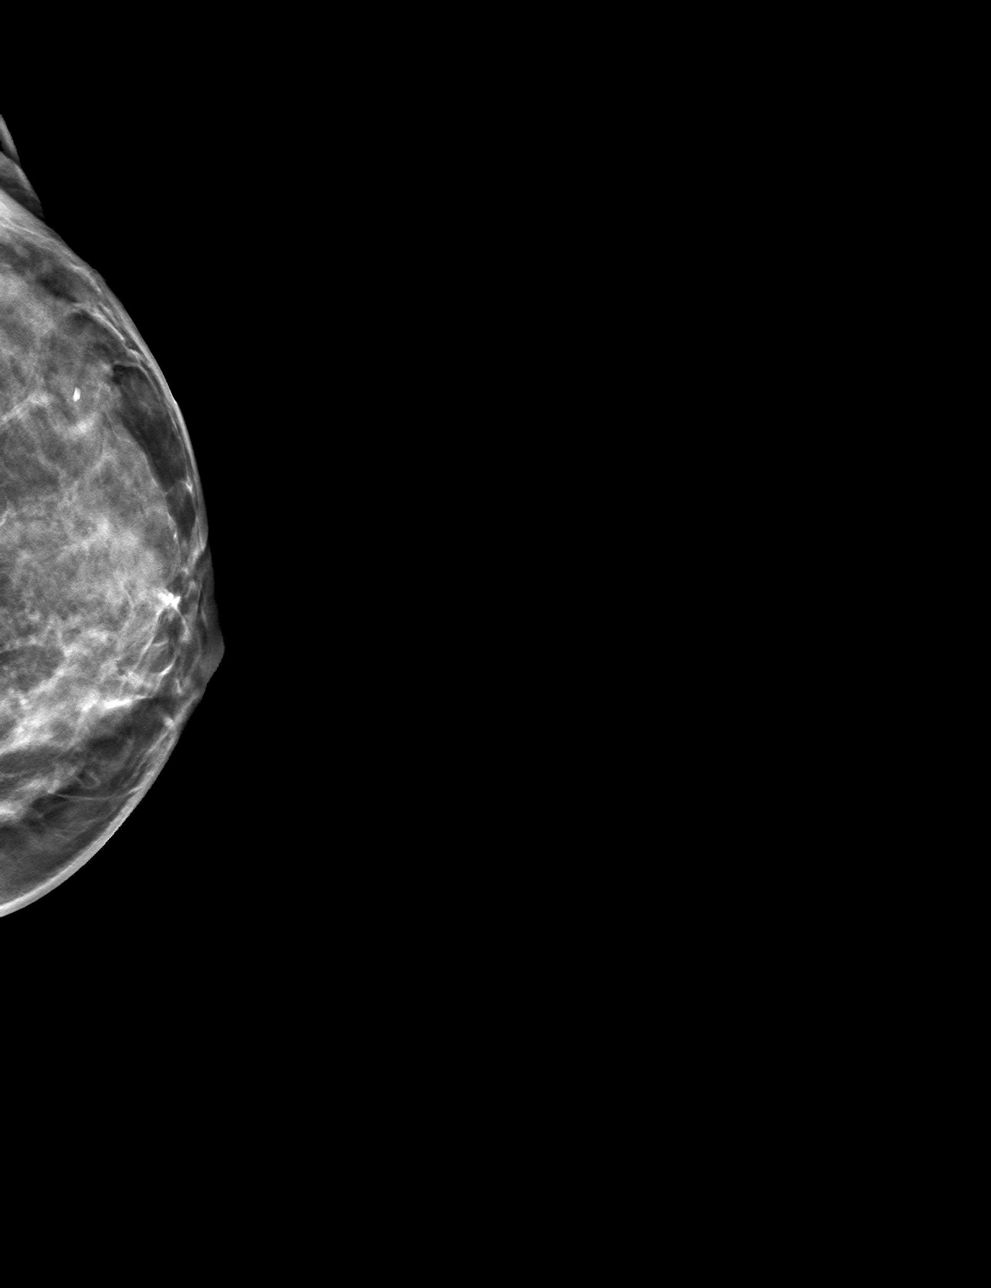

[L MLO tomo · tomo slice 23/46.0]
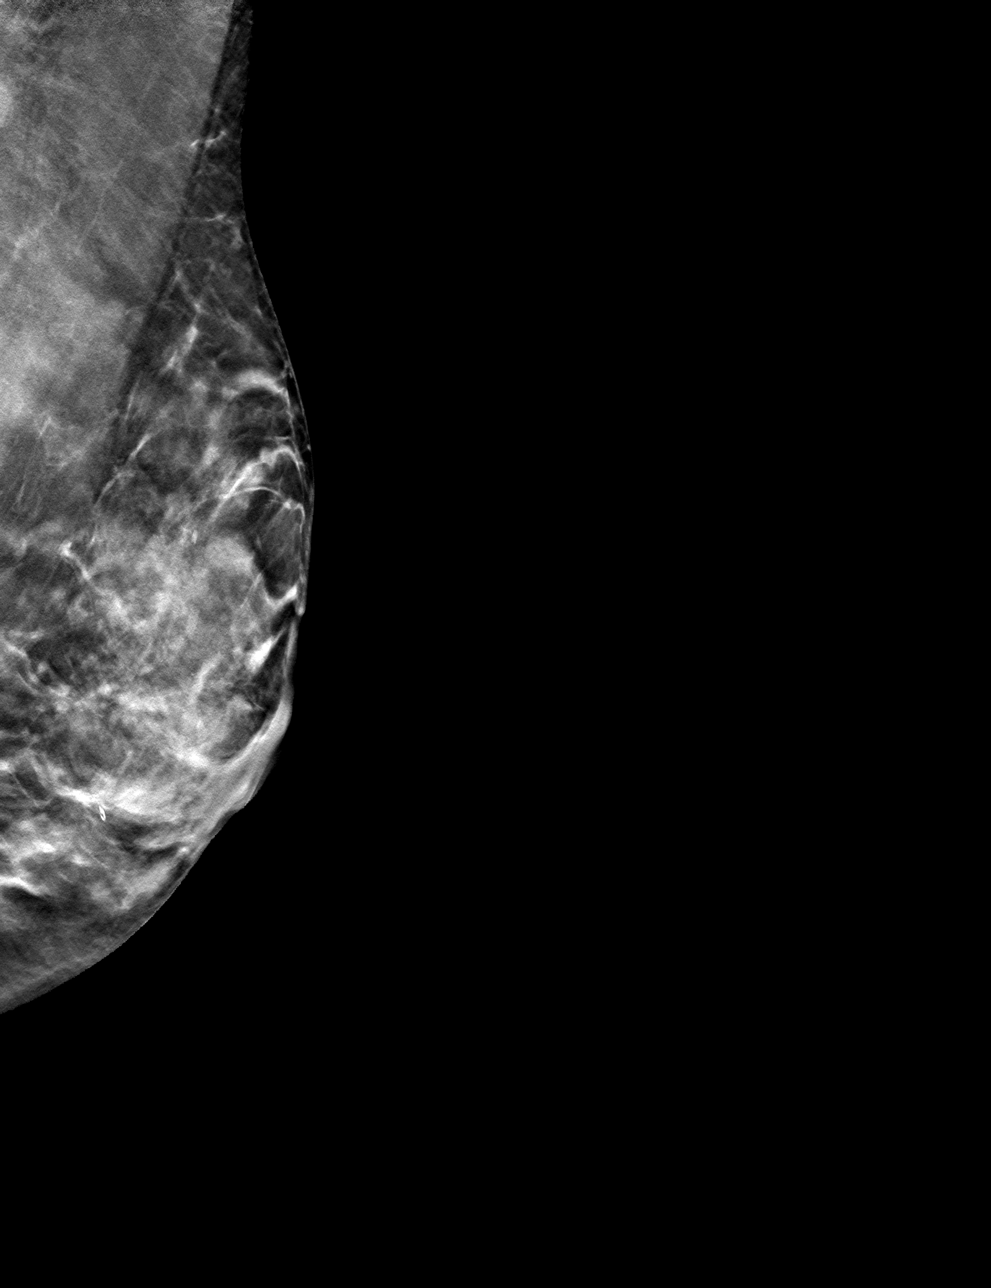

[4 of 12 positions shown; findings below may reference images not displayed]

FINDINGS: Mammographic images were obtained following ultrasound guided biopsy
of the bilateral breasts. The biopsy marking clips are in expected
position at the site of biopsy.
IMPRESSION: 1. Appropriate positioning of the ribbon shaped biopsy marking clip
at the site of biopsy in the upper slightly outer right breast.

2. Appropriate positioning of the ribbon shaped biopsy marking clip
at the site of biopsy in the lower-inner left breast.

3. Appropriate positioning of the coil shaped biopsy marking clip at
the site of biopsy in the upper-outer left breast.

Final Assessment: Post Procedure Mammograms for Marker Placement

## 2019-09-21 ENCOUNTER — Other Ambulatory Visit: Payer: Self-pay | Admitting: General Surgery

## 2019-09-21 DIAGNOSIS — N6489 Other specified disorders of breast: Secondary | ICD-10-CM

## 2019-09-21 DIAGNOSIS — R928 Other abnormal and inconclusive findings on diagnostic imaging of breast: Secondary | ICD-10-CM

## 2019-10-02 ENCOUNTER — Other Ambulatory Visit: Payer: Self-pay | Admitting: General Surgery

## 2019-10-05 ENCOUNTER — Ambulatory Visit
Admission: RE | Admit: 2019-10-05 | Discharge: 2019-10-05 | Disposition: A | Discharge status: Home / Self Care | Payer: BC Managed Care – PPO | Source: Ambulatory Visit | Attending: General Surgery | Admitting: General Surgery

## 2019-10-05 DIAGNOSIS — N6489 Other specified disorders of breast: Secondary | ICD-10-CM

## 2019-10-05 IMAGING — MR MR BREAST BILAT WO/W CM
9 of 13 series · 30 of 48 positions shown · IV contrast (5 gadavist)
Comparison: Multiple prior mammograms and ultrasounds.

CLINICAL DATA: 49-year-old female with recent biopsies of 1 complex
sclerosing lesion within the RIGHT breast, 2 complex sclerosing
lesions within the LEFT breast. The patient also additional
irregular breast masses identified sonographically which were not
sampled, 2 additional on the RIGHT and 1 additional on the LEFT.

LABS:  None performed today
EXAM:
BILATERAL BREAST MRI WITH AND WITHOUT CONTRAST
TECHNIQUE: Multiplanar, multisequence MR images of both breasts were obtained
prior to and following the intravenous administration of 5 ml of
Gadavist

[Series 2: t2_tirm_tra ipat (a-p) · axial · 3.0mm · 0.70mm/px · 1 of 50 slices shown (1 of 2)]
[im 1/50]
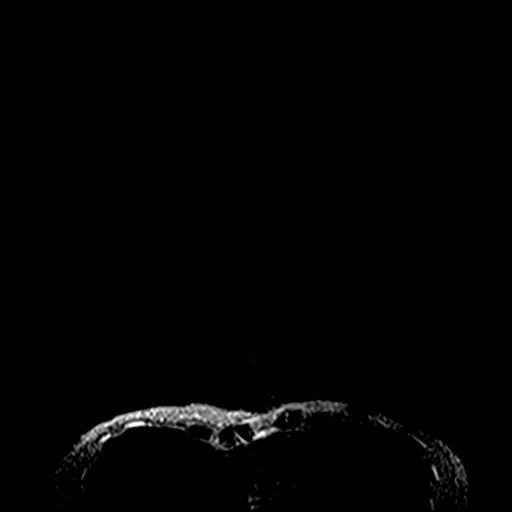

[Series 3: fl3d pre-cm no · axial · non-contrast · 1.2mm · 0.94mm/px · z∈[-59,+113]mm · 5 of 144 slices shown]
[im 1/144]
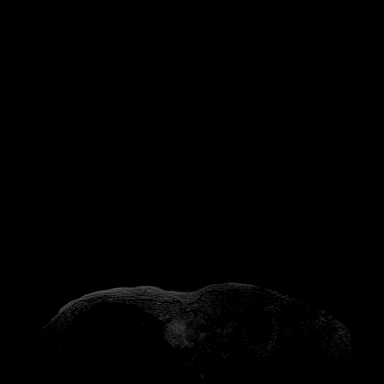
[im 36/144]
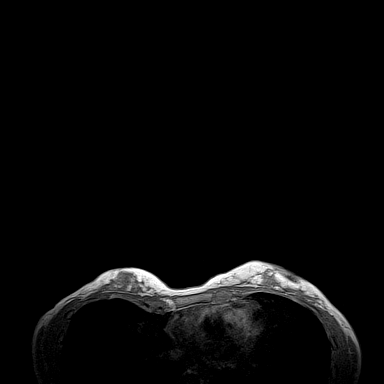
[im 72/144]
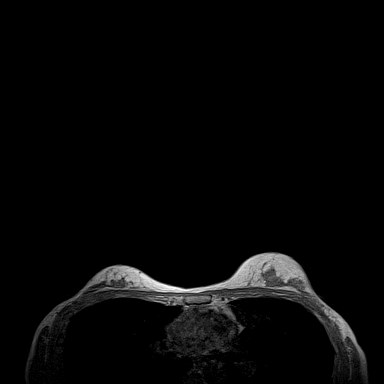
[im 108/144]
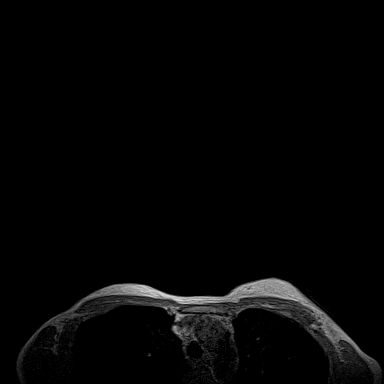
[im 144/144]
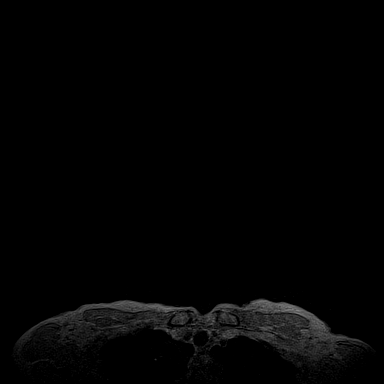

[Series 4: t2_tirm_tra ipat (a-p) · axial · 3.0mm · 0.70mm/px · 1 of 50 slices shown (2 of 2)]
[im 1/50]
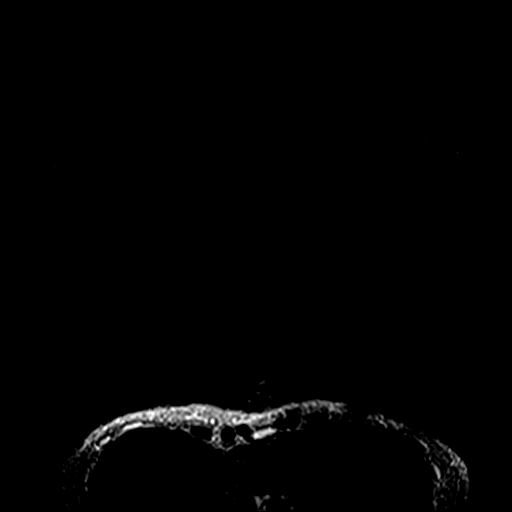

[Series 5: fl3d pre-cm · axial · non-contrast · 1.2mm · 0.94mm/px · z∈[-59,+113]mm · 5 of 144 slices shown]
[im 1/144]
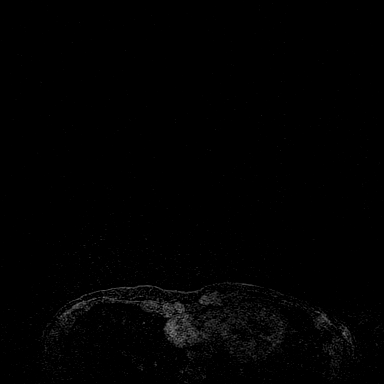
[im 36/144]
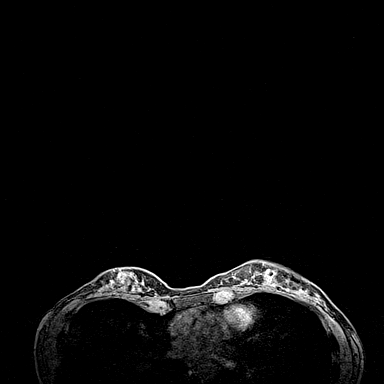
[im 72/144]
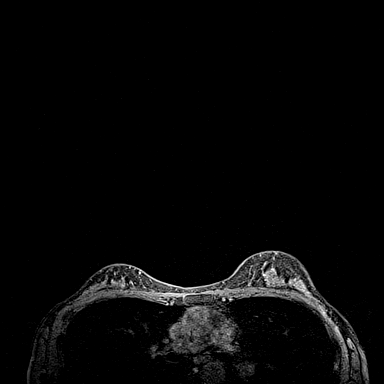
[im 108/144]
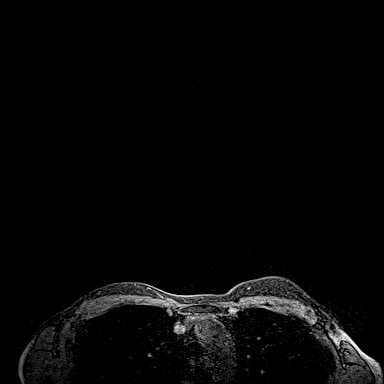
[im 144/144]
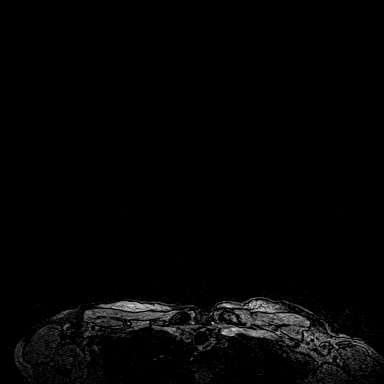

[Series 6: fl3d post-cm 20 · axial · 1.2mm · 0.94mm/px · z∈[-59,+113]mm · 5 of 144 slices shown (1 of 3)]
[im 1/144]
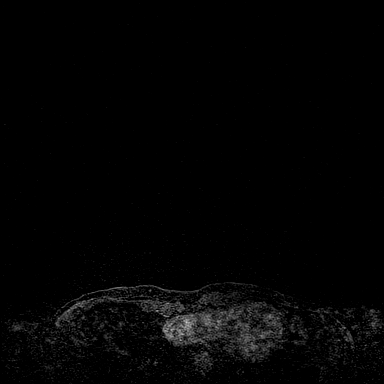
[im 36/144]
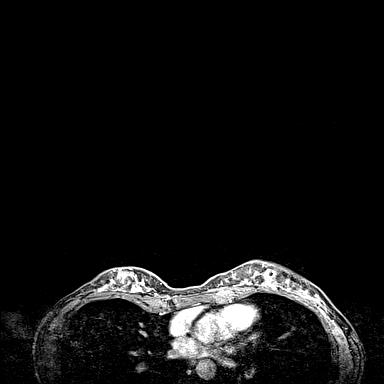
[im 72/144]
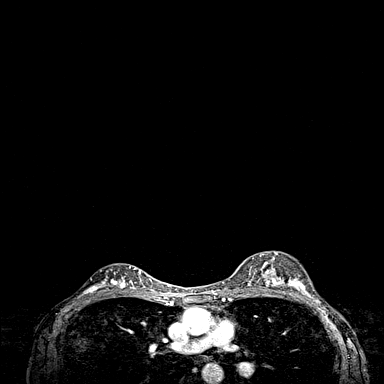
[im 108/144]
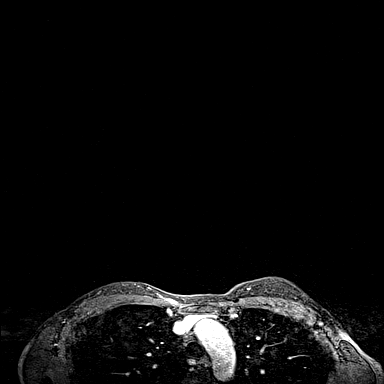
[im 144/144]
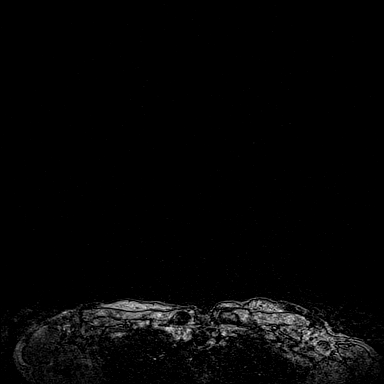

[Series 7: fl3d post-cm 20 · axial · 1.2mm · 0.94mm/px · z∈[-59,+113]mm · 5 of 144 slices shown (2 of 3)]
[im 1/144]
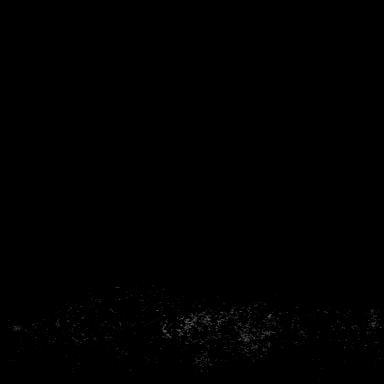
[im 36/144]
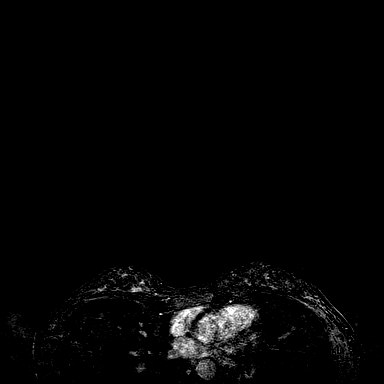
[im 72/144]
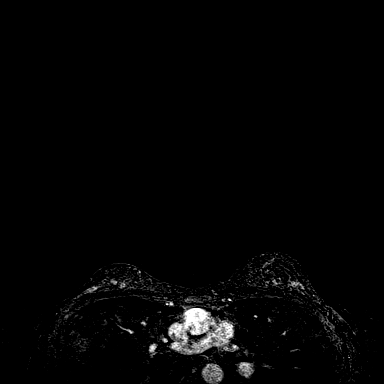
[im 108/144]
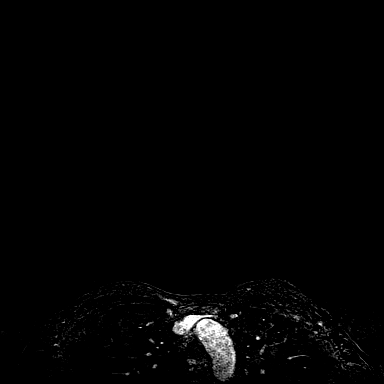
[im 144/144]
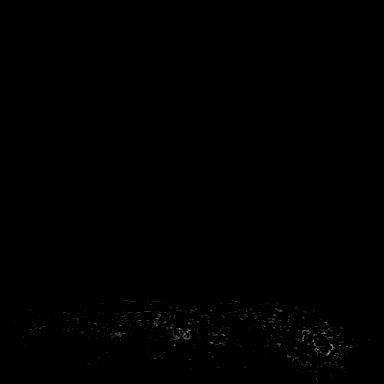

[Series 8: fl3d post-cm 20 · axial · 172.8mm · 0.94mm/px · 1 of 1 slices shown (3 of 3)]
[im 1/1]
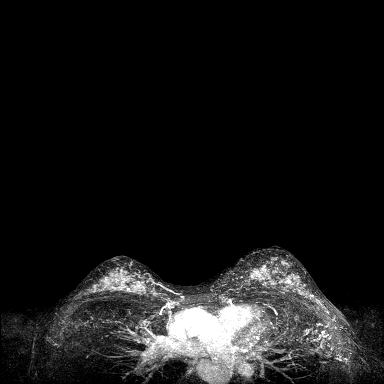

[Series 9: fl3d post-cm 3min · axial · 1.2mm · 0.94mm/px · z∈[-59,+113]mm · 5 of 144 slices shown]
[im 1/144]
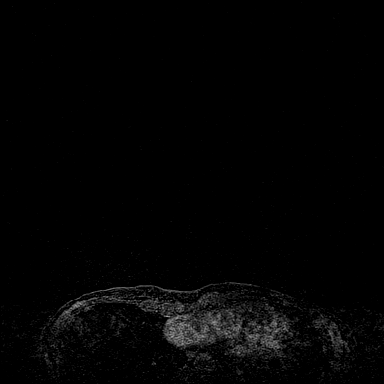
[im 36/144]
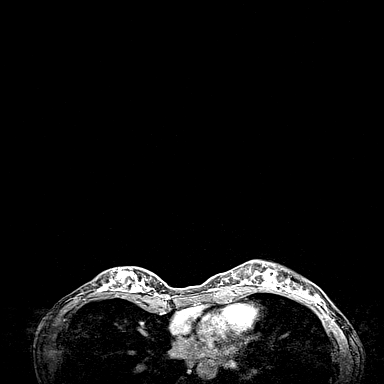
[im 72/144]
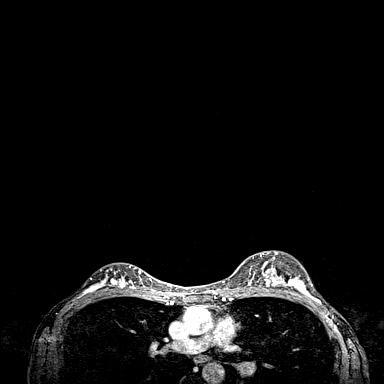
[im 108/144]
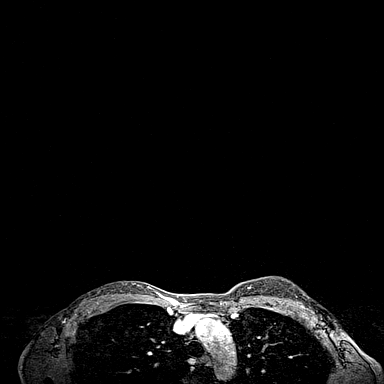
[im 144/144]
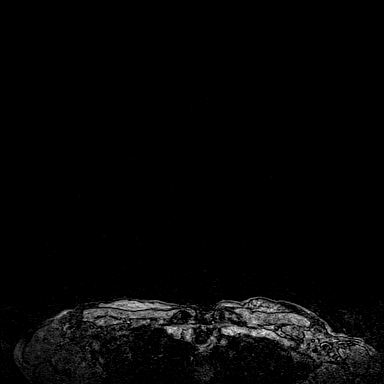

[Series 10: fl3d post-cm 3min_sub · axial · 1.2mm · 0.94mm/px · z∈[-59,-25]mm · 2 of 144 slices shown]
[im 1/144]
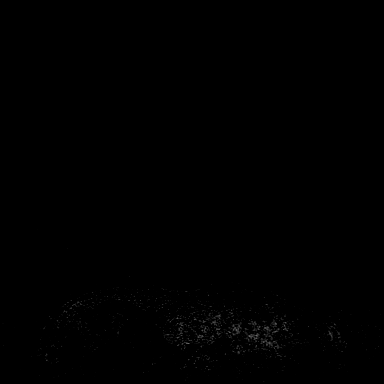
[im 29/144]
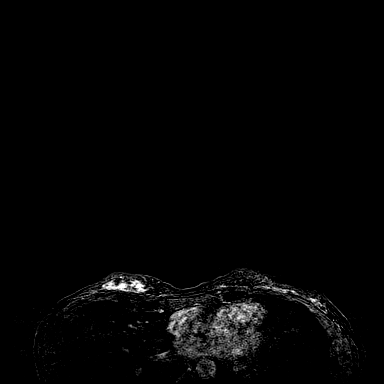

[30 of 48 positions shown; findings below may reference images not displayed]

Three-dimensional MR images were rendered by post-processing of the
original MR data on an independent workstation. The
three-dimensional MR images were interpreted, and findings are
reported in the following complete MRI report for this study. Three
dimensional images were evaluated at the independent interpreting
workstation using the [employer] thin client.
FINDINGS: Breast composition: c. Heterogeneous fibroglandular tissue.

Background parenchymal enhancement: Moderate

Right breast: There are multiple patchy areas of non masslike
enhancement throughout the RIGHT breast (difficult to enumerate but
at least 8 areas). No discrete masses are identified.

Biopsy clip artifact within the central RIGHT breast identified in
an area of patchy non masslike enhancement and does not correspond
to a discrete mass (series 10: Image 94).

Left breast: Multiple patchy areas of non masslike enhancement are
noted throughout the LEFT breast (difficult to enumerate but at
least 7 areas).

Biopsy clip artifact within a 1.4 x 1 x 1.6 cm irregular mass within
the posterior UPPER INNER LEFT breast noted ([DATE]).

Biopsy clip artifact within the UPPER OUTER LEFT breast identified
in area of patchy non masslike enhancement ([DATE]).

No other discrete masses are identified.

Lymph nodes: No abnormal appearing lymph nodes.

Ancillary findings:  None.
IMPRESSION: 1. Multiple patchy areas of non masslike enhancement throughout both
breasts. The biopsy-proven complex sclerosing lesions within the
central RIGHT breast and UPPER OUTER LEFT breast are identified as
patchy non masslike enhancement. The complex sclerosing lesion
within the posterior UPPER INNER LEFT breast is manifested by the
only discrete mass identified by MR. It is difficult to determine if
the other multiple bilateral patchy areas of non masslike
enhancement represent complex sclerosing lesions, normal breast
tissue or other process.
2. The 2 additional irregular RIGHT breast masses and 1 additional
irregular LEFT breast mass identified sonographically and have not
been biopsied are not visualized as discrete masses on this
examination. Recommend tissue sampling given sonographic appearance
although these more likely represent additional complex sclerosing
lesions.

RECOMMENDATION:
1. Recommend ultrasound-guided biopsies of the 2 additional RIGHT
breast masses and 1 additional LEFT breast mass identified on recent
ultrasound, as clinically indicated.
2. Given MR appearance of the breasts and multiplicity of complex
sclerosing lesions, screening breast MRI may be useful for future
follow-up.

BI-RADS CATEGORY  2: Benign.

## 2019-10-05 MED ORDER — GADOBUTROL 1 MMOL/ML IV SOLN
5.0000 mL | Freq: Once | INTRAVENOUS | Status: AC | PRN
  Administered 2019-10-05: 5 mL via INTRAVENOUS

## 2019-10-15 ENCOUNTER — Other Ambulatory Visit: Payer: Self-pay | Admitting: General Surgery

## 2019-10-15 DIAGNOSIS — N6489 Other specified disorders of breast: Secondary | ICD-10-CM

## 2019-10-16 ENCOUNTER — Other Ambulatory Visit: Payer: Self-pay | Admitting: General Surgery

## 2019-10-16 DIAGNOSIS — N6489 Other specified disorders of breast: Secondary | ICD-10-CM

## 2019-10-30 ENCOUNTER — Ambulatory Visit
Admission: RE | Admit: 2019-10-30 | Discharge: 2019-10-30 | Disposition: A | Discharge status: Home / Self Care | Payer: BC Managed Care – PPO | Source: Ambulatory Visit | Attending: General Surgery | Admitting: General Surgery

## 2019-10-30 ENCOUNTER — Other Ambulatory Visit: Payer: Self-pay

## 2019-10-30 DIAGNOSIS — N6489 Other specified disorders of breast: Secondary | ICD-10-CM

## 2019-10-30 HISTORY — PX: BREAST BIOPSY: SHX20

## 2019-10-30 IMAGING — MG MM BREAST LOCALIZATION CLIP
8 series · 9 of 24 positions shown · non-contrast
Comparison: Previous exam(s).

CLINICAL DATA: Post biopsy mammogram of the bilateral breasts for
clip placement.

EXAM:
DIAGNOSTIC BILATERAL MAMMOGRAM POST ULTRASOUND BIOPSY

[R ML synth-2D (1 of 2)]
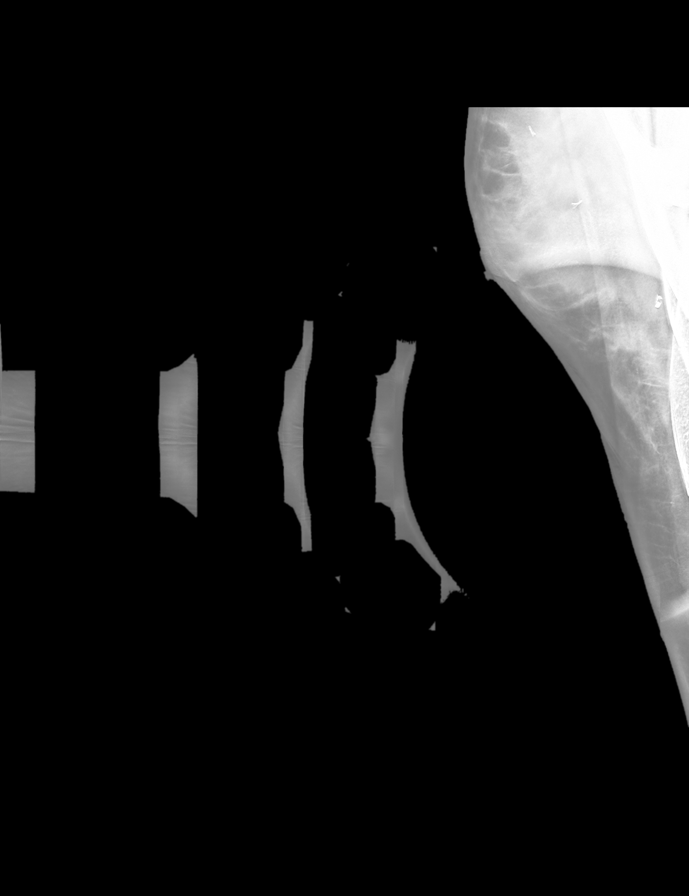

[R XCCL synth-2D]
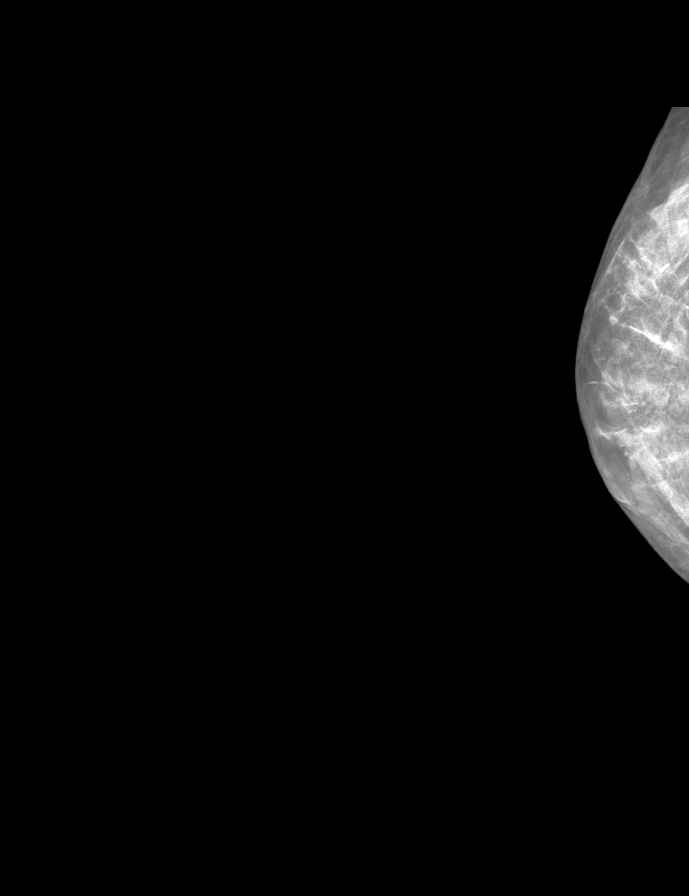

[R ML synth-2D (2 of 2)]
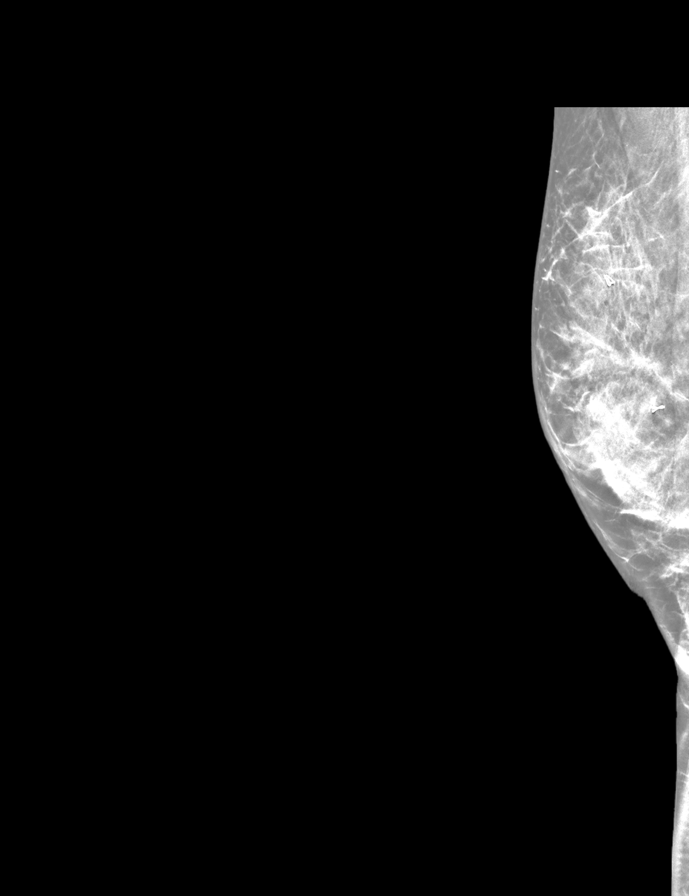

[R CC synth-2D]
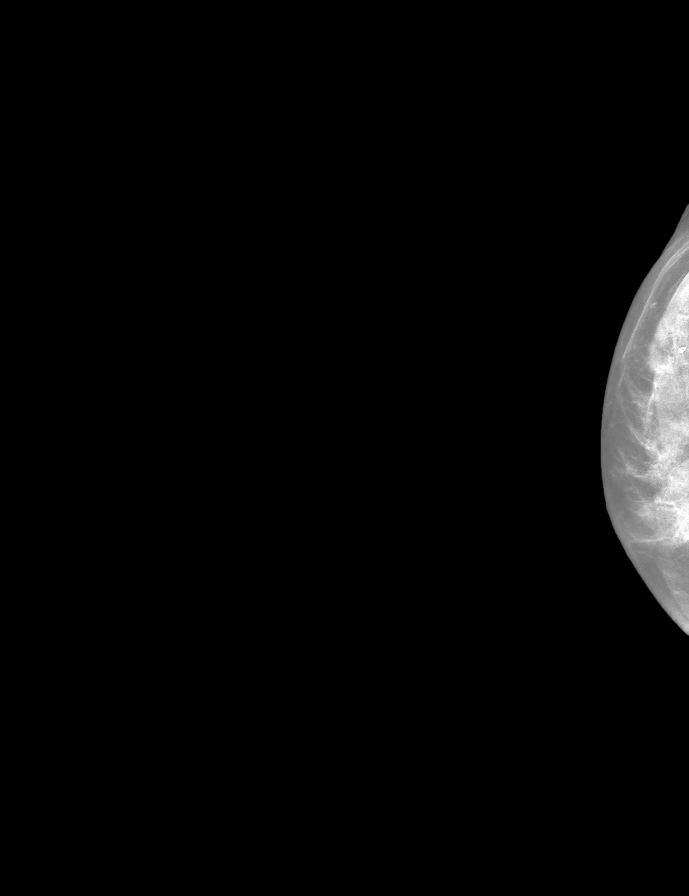

[R CC tomo · 2 of 50 frames shown]
[frame 17/50]
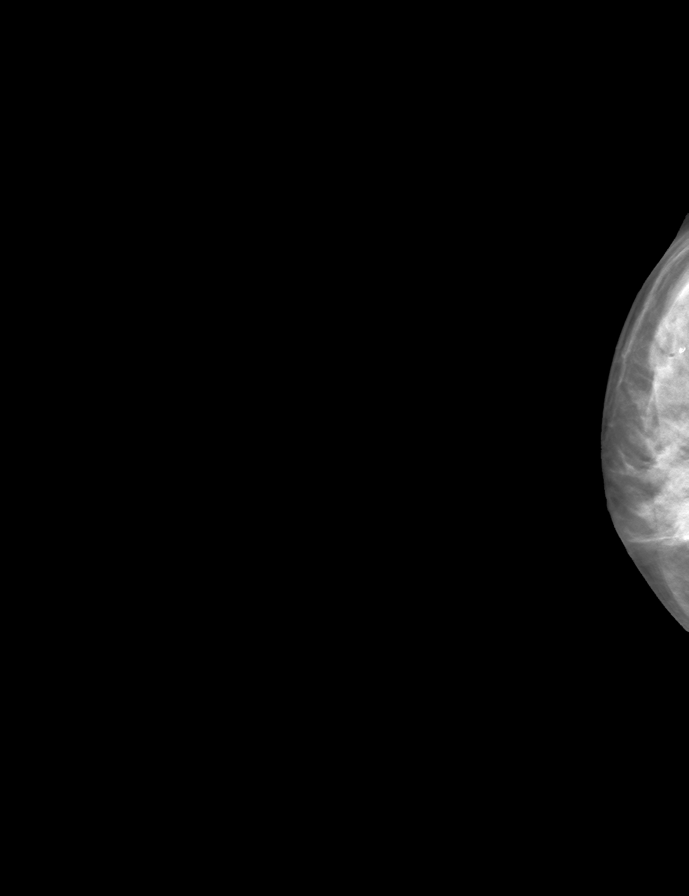
[frame 25/50]
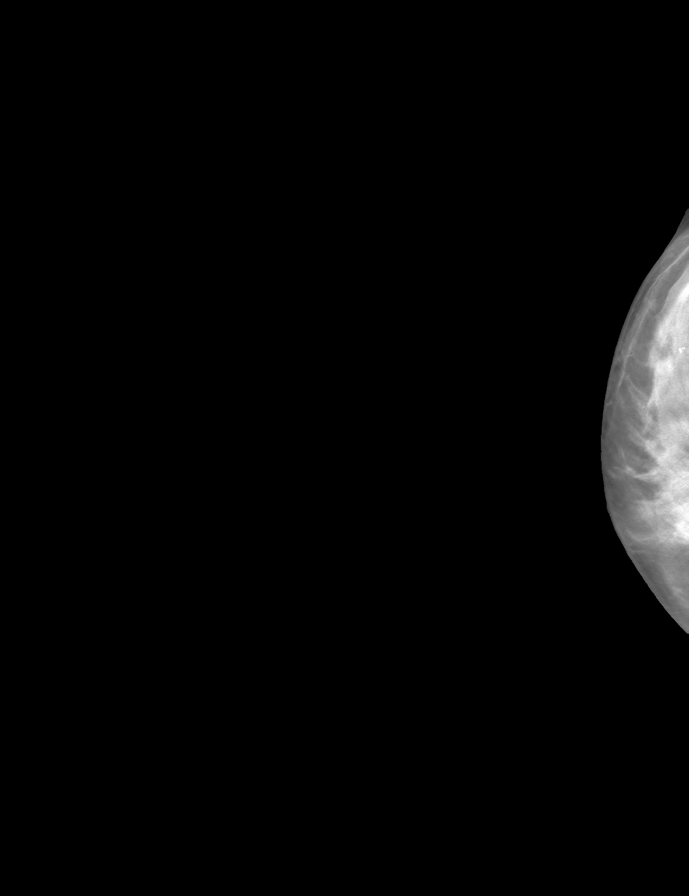

[R ML tomo (1 of 2) · tomo slice 34/67.0]
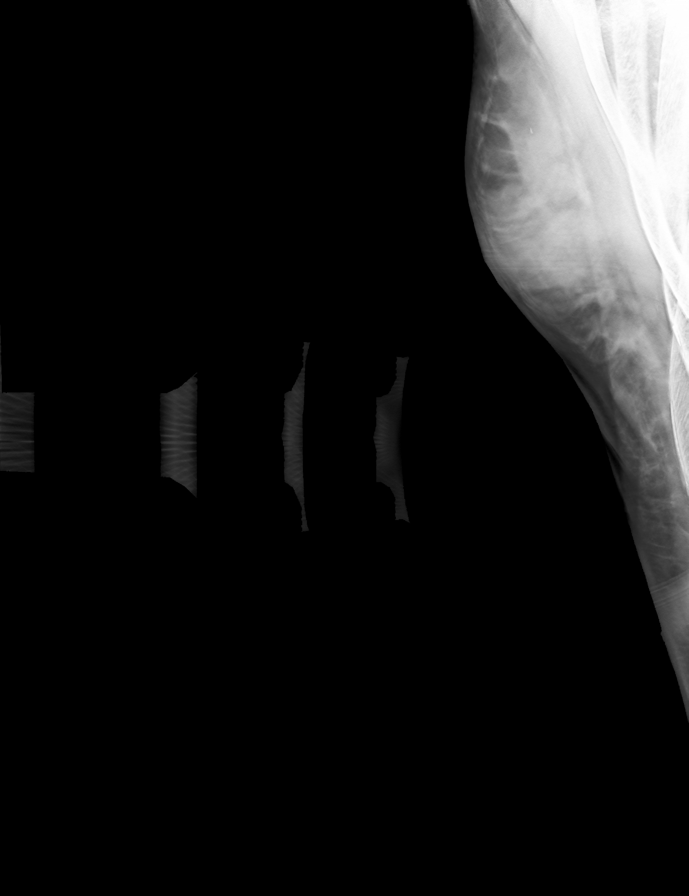

[R XCCL tomo · tomo slice 22/43.0]
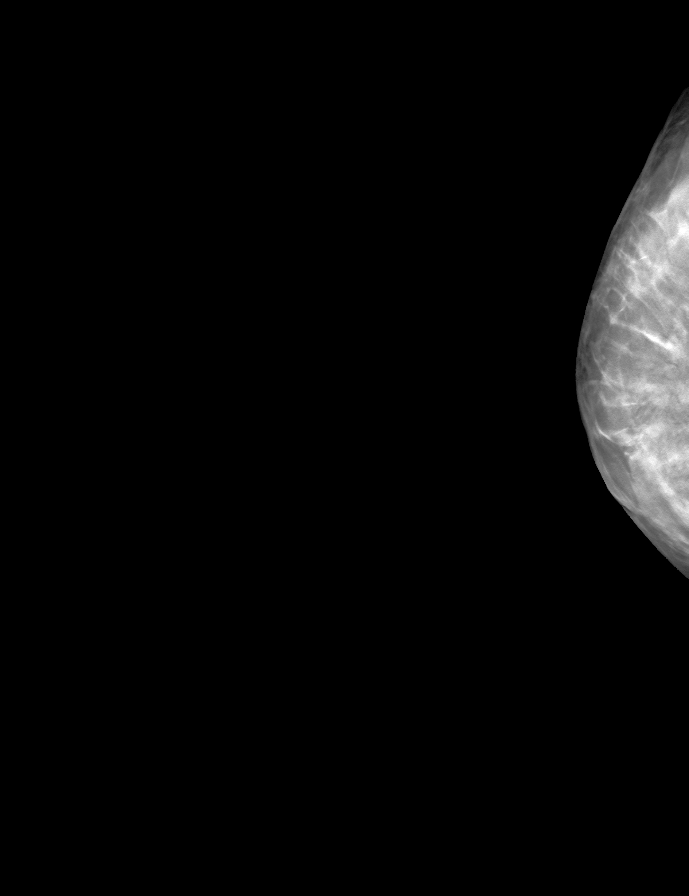

[R ML tomo (2 of 2) · tomo slice 19/38.0]
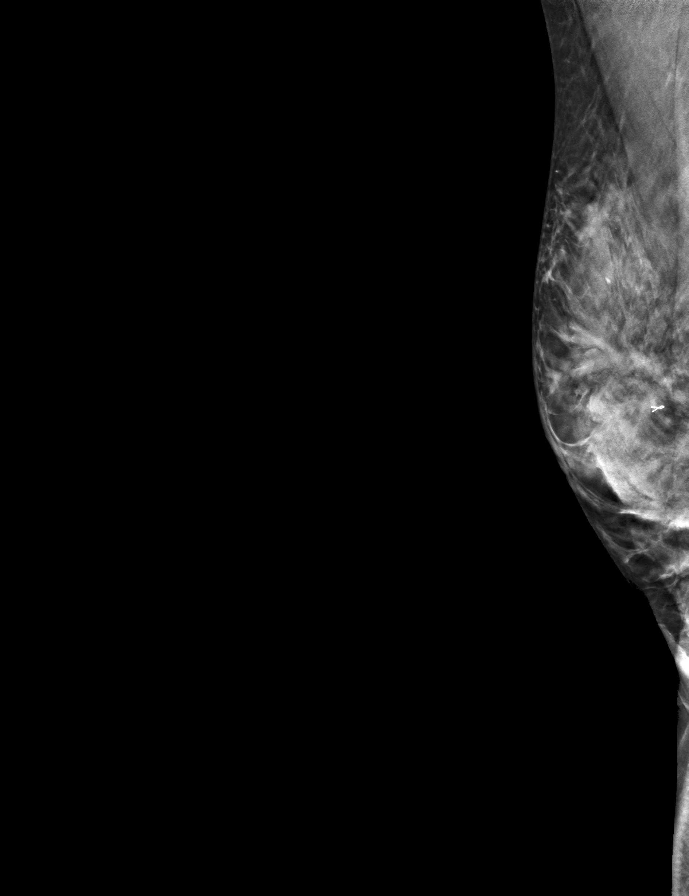

[9 of 24 positions shown; findings below may reference images not displayed]

FINDINGS: Mammographic images were obtained following ultrasound guided biopsy
of the bilateral breasts. Due to the location of the biopsy sites in
the right breast, as well as the patient's pectus excavatum, the 2
biopsy marking clips placed today in the right breast could only be
seen in 1 projection, but appear to be appropriately positioned. The
biopsy marking clips are in the expected positions of biopsy in the
left breast.
IMPRESSION: 1. Appropriate positioning of the wing shaped biopsy marking clip in
the lateral aspect of the right breast.

2. Appropriate positioning of the coil shaped biopsy marking clip in
the inferior aspect of the right breast.

3. Appropriate positioning of the heart shaped biopsy marking clip
at the site of biopsy in the upper inner left breast.

Final Assessment: Post Procedure Mammograms for Marker Placement

## 2019-10-30 IMAGING — US US  BREAST BX W/ LOC DEV 1ST LESION IMG BX SPEC US GUIDE*R*
1 series · 15 of 21 positions shown · non-contrast
Comparison: Previous exam(s).
COMPARISON: Previous exam(s).

Addendum:
CLINICAL DATA: 49-year-old female presenting for 2
ultrasound-guided biopsies of the right breast and 1 of the left
breast.

PROCEDURE:
ULTRASOUND GUIDED BILATERAL BREAST CORE NEEDLE BIOPSY

[Series 1: us breast bx w/ loc dev 1st lesion img bx spec us  · 0.06mm/px · 15 of 21 slices shown]
[im 1/21]
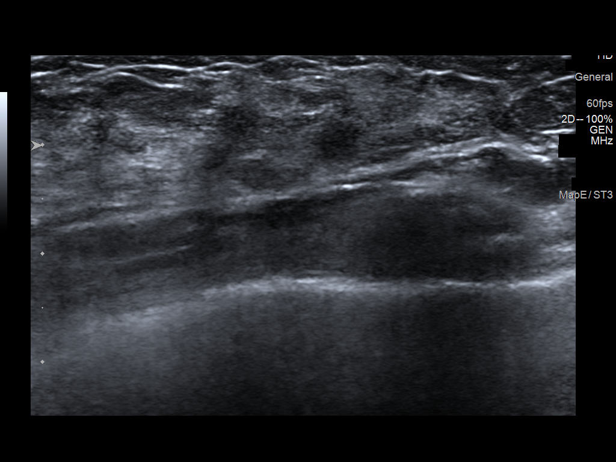
[im 3/21]
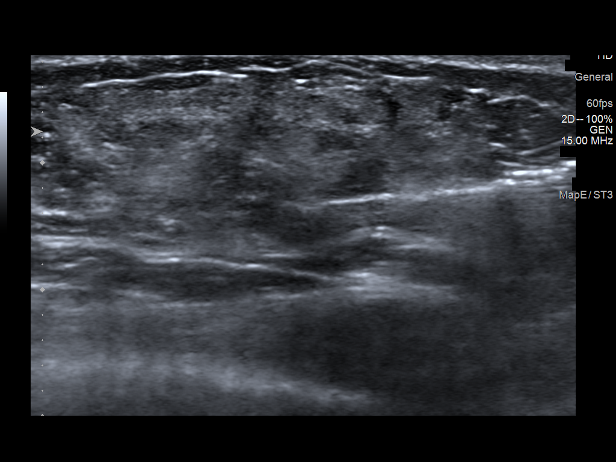
[im 4/21]
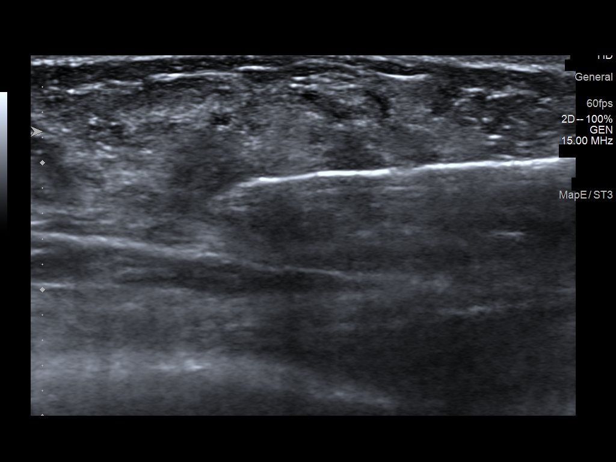
[im 5/21]
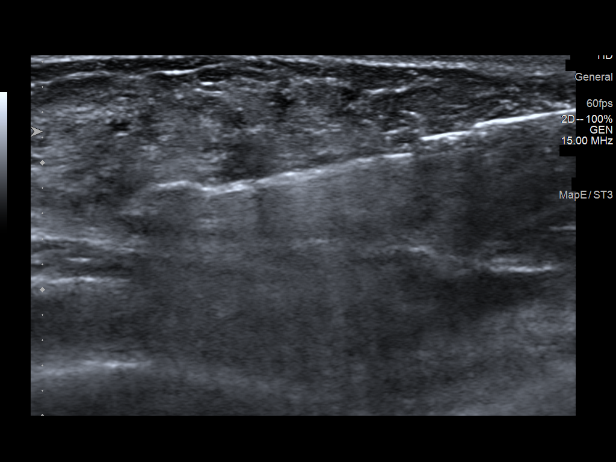
[im 7/21]
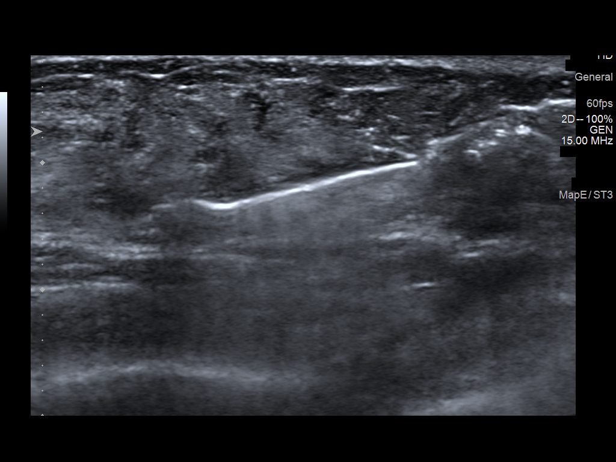
[im 8/21]
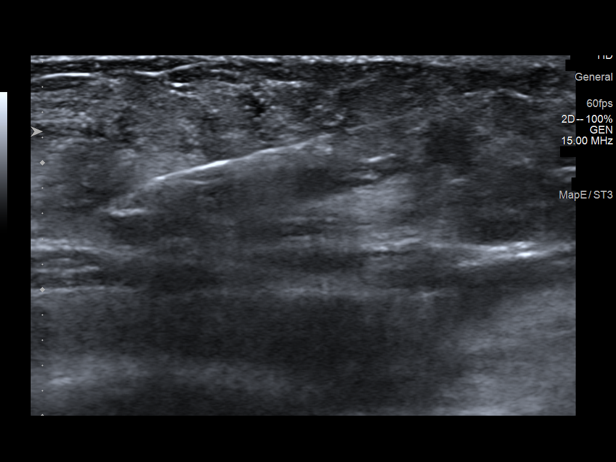
[im 10/21]
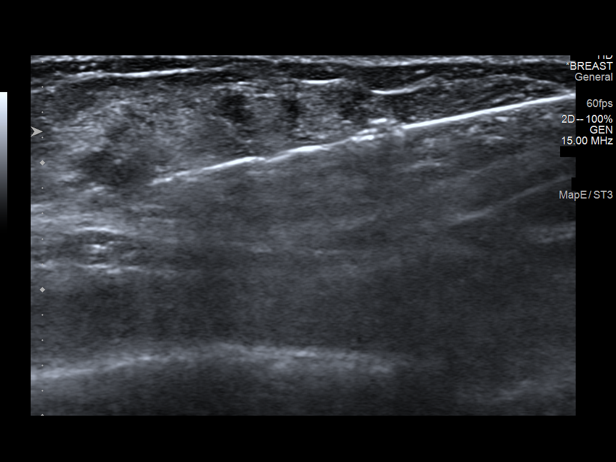
[im 11/21]
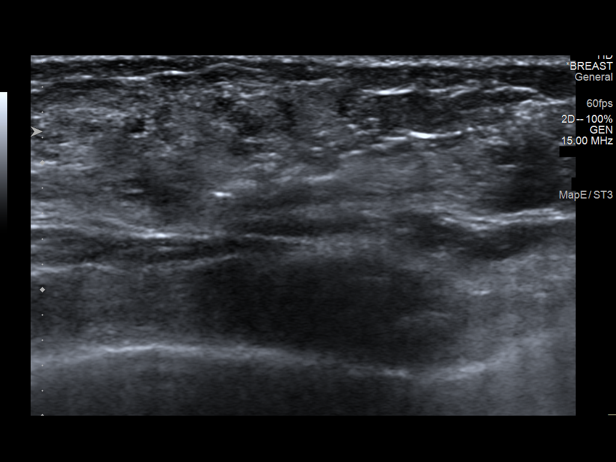
[im 12/21]
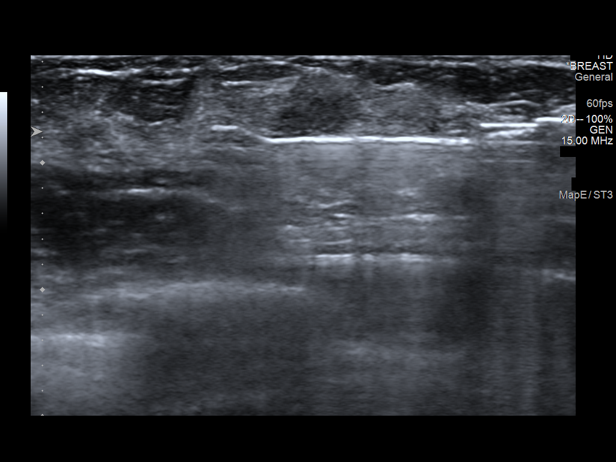
[im 14/21]
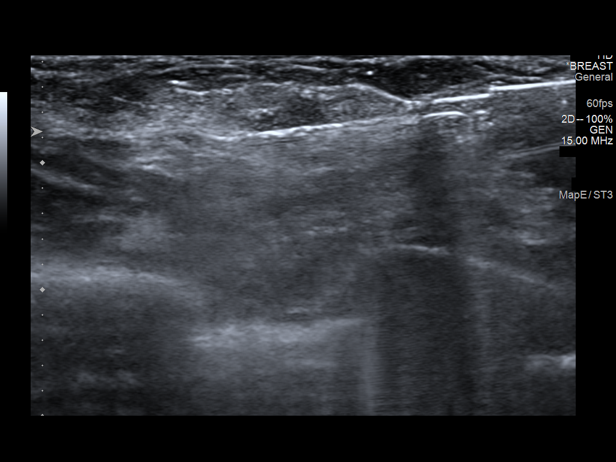
[im 15/21]
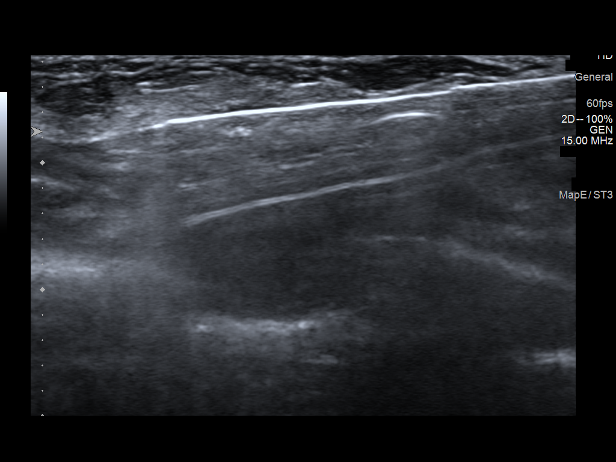
[im 17/21]
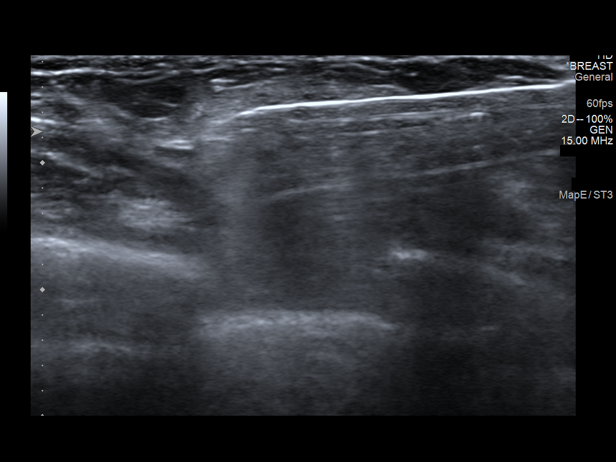
[im 18/21]
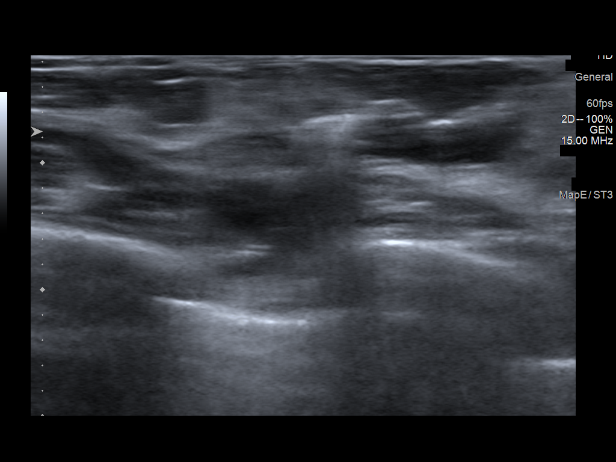
[im 19/21]
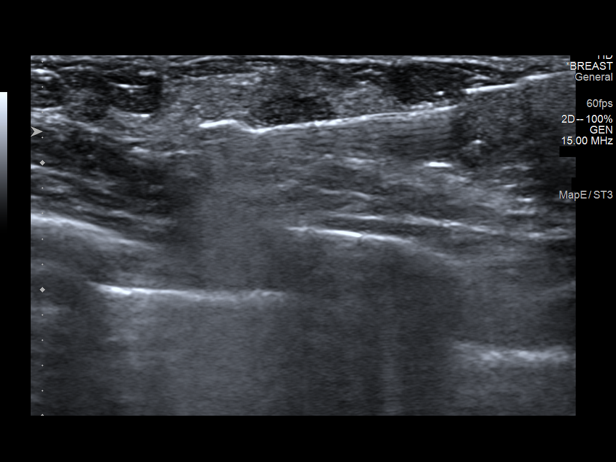
[im 21/21]
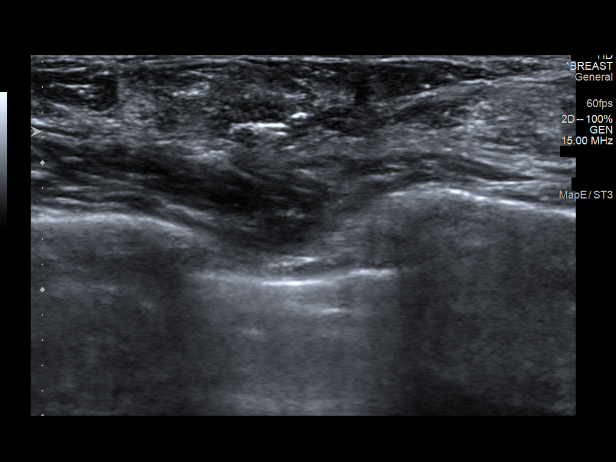

[15 of 21 positions shown; findings below may reference images not displayed]



#1 Lesion quadrant: Lower inner quadrant

Using sterile technique and 1% Lidocaine as local anesthetic, under
direct ultrasound visualization, a 14 gauge SAMSONAITE device was
used to perform biopsy of a mass in the right breast at 5 o'clock
using an inferior approach. At the conclusion of the procedure a
coil shaped tissue marker clip was deployed into the biopsy cavity.

--------------------------------------------------------------------------------------------------------------------------------------------

#2 Lesion quadrant: Upper outer quadrant

Using sterile technique and 1% Lidocaine as local anesthetic, under
direct ultrasound visualization, a 14 gauge SAMSONAITE device was
used to perform biopsy of a mass in the right breast at 10 o'clock
using an inferior approach. At the conclusion of the procedure a
heart shaped tissue marker clip was deployed into the biopsy cavity.

--------------------------------------------------------------------------------------------------------------------------------------------

#3 Lesion quadrant: Upper outer quadrant

Using sterile technique and 1% Lidocaine as local anesthetic, under
direct ultrasound visualization, a 14 gauge SAMSONAITE device was
used to perform biopsy of a mass in the left breast at 10 o'clock
using a superolateral approach. At the conclusion of the procedure a
heart shaped tissue marker clip was deployed into the biopsy cavity.

Follow up 2 view mammogram was performed and dictated separately.
IMPRESSION: 1. Ultrasound guided biopsy of a right breast mass at 5 o'clock
(coil clip). No apparent complications.

2. Ultrasound guided biopsy of a right breast mass at 10 o'clock
(heart clip). No apparent complications.

3. Ultrasound guided biopsy of a left breast mass at 10 o'clock
(heart clip). No apparent complications.

ADDENDUM:
Pathology revealed COMPLEX SCLEROSING LESION WITH USUAL DUCTAL
HYPERPLASIA of the RIGHT breast, 5 o'clock. This was found to be
concordant by Dr. SAMSONAITE, with consultation for possible
excision recommended.

Pathology revealed COMPLEX SCLEROSING LESION WITH USUAL DUCTAL
HYPERPLASIA of the RIGHT breast, 10 o'clock. This was found to be
concordant by Dr. SAMSONAITE, with consultation for possible
excision recommended.

Pathology revealed COMPLEX SCLEROSING LESION WITH USUAL DUCTAL
HYPERPLASIA of the LEFT breast, 10 o'clock. This was found to be
concordant by Dr. SAMSONAITE, with consultation for possible
excision recommended.

Pathology results were discussed with the patient by telephone. The
patient reported doing well after the biopsies with tenderness at
the sites. Post biopsy instructions and care were reviewed and
questions were answered. The patient was encouraged to call The
direct phone number was provided.

The patient has a biopsy proven RIGHT breast complex sclerosing
lesion @ 10:30 o'clock location, and biopsy proven LEFT breast
complex sclerosing lesion x 2, @ 7 o'clock and 1 o'clock locations,
and is under the care of Dr. SAMSONAITE at [REDACTED]. Dr. SAMSONAITE was notified of biopsy results via [REDACTED] message
on [DATE].

Pathology results reported by SAMSONAITE, RN on [DATE].



#1 Lesion quadrant: Lower inner quadrant

Using sterile technique and 1% Lidocaine as local anesthetic, under
direct ultrasound visualization, a 14 gauge SAMSONAITE device was
used to perform biopsy of a mass in the right breast at 5 o'clock
using an inferior approach. At the conclusion of the procedure a
coil shaped tissue marker clip was deployed into the biopsy cavity.

--------------------------------------------------------------------------------------------------------------------------------------------

#2 Lesion quadrant: Upper outer quadrant

Using sterile technique and 1% Lidocaine as local anesthetic, under
direct ultrasound visualization, a 14 gauge SAMSONAITE device was
used to perform biopsy of a mass in the right breast at 10 o'clock
using an inferior approach. At the conclusion of the procedure a
heart shaped tissue marker clip was deployed into the biopsy cavity.

--------------------------------------------------------------------------------------------------------------------------------------------

#3 Lesion quadrant: Upper outer quadrant

Using sterile technique and 1% Lidocaine as local anesthetic, under
direct ultrasound visualization, a 14 gauge SAMSONAITE device was
used to perform biopsy of a mass in the left breast at 10 o'clock
using a superolateral approach. At the conclusion of the procedure a
heart shaped tissue marker clip was deployed into the biopsy cavity.

Follow up 2 view mammogram was performed and dictated separately.
IMPRESSION: 1. Ultrasound guided biopsy of a right breast mass at 5 o'clock
(coil clip). No apparent complications.

2. Ultrasound guided biopsy of a right breast mass at 10 o'clock
(heart clip). No apparent complications.

3. Ultrasound guided biopsy of a left breast mass at 10 o'clock
(heart clip). No apparent complications.

## 2019-10-30 IMAGING — MG MM BREAST LOCALIZATION CLIP
2 series · 2 of 10 positions shown · non-contrast
Comparison: Previous exam(s).

CLINICAL DATA: Post biopsy mammogram of the bilateral breasts for
clip placement.

EXAM:
DIAGNOSTIC BILATERAL MAMMOGRAM POST ULTRASOUND BIOPSY

[L ML tomo · tomo slice 19/38.0]
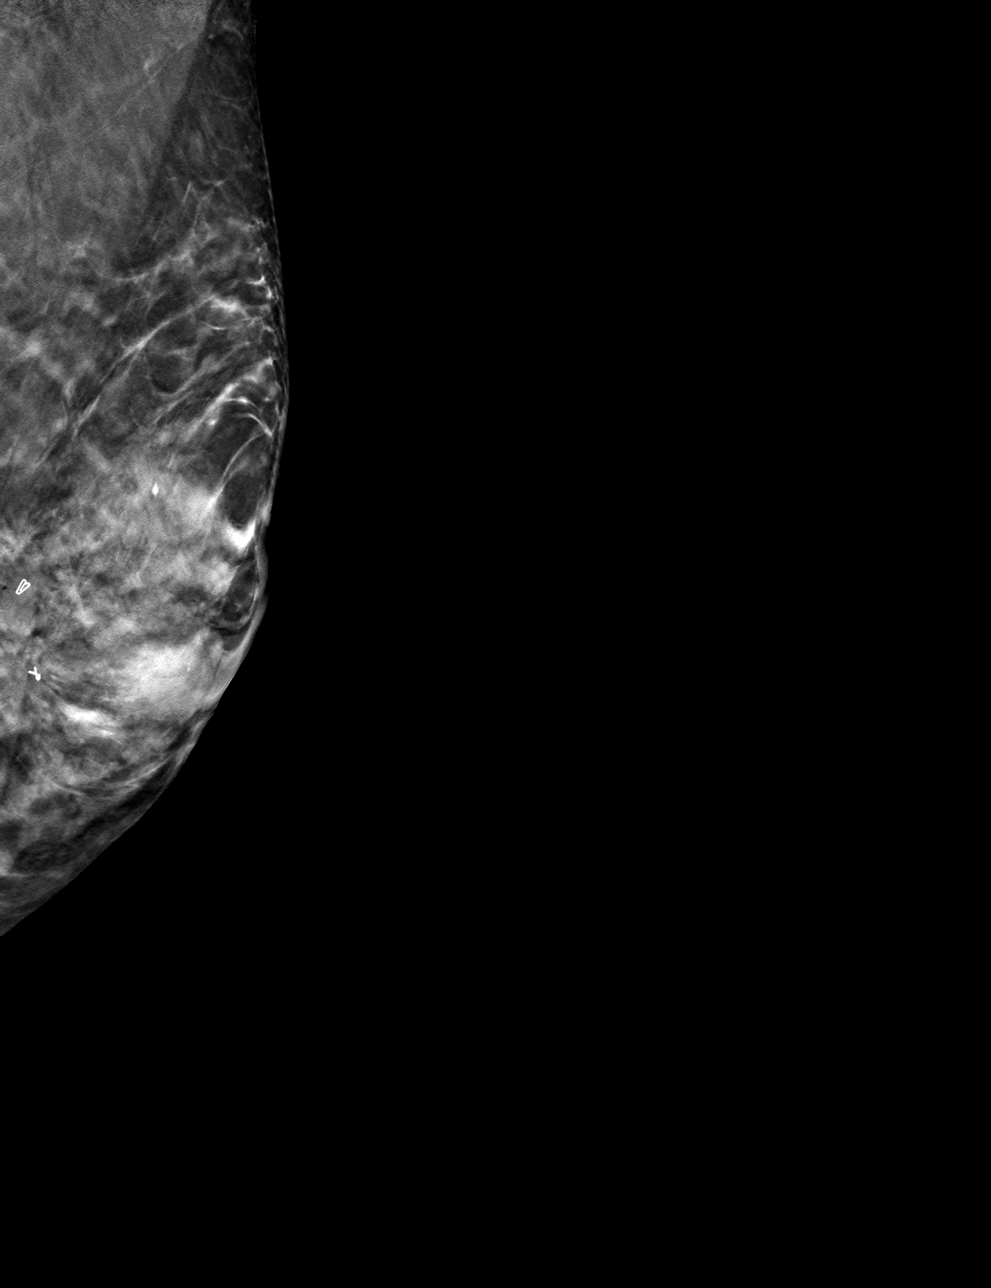

[L CC tomo · tomo slice 29/56.0]
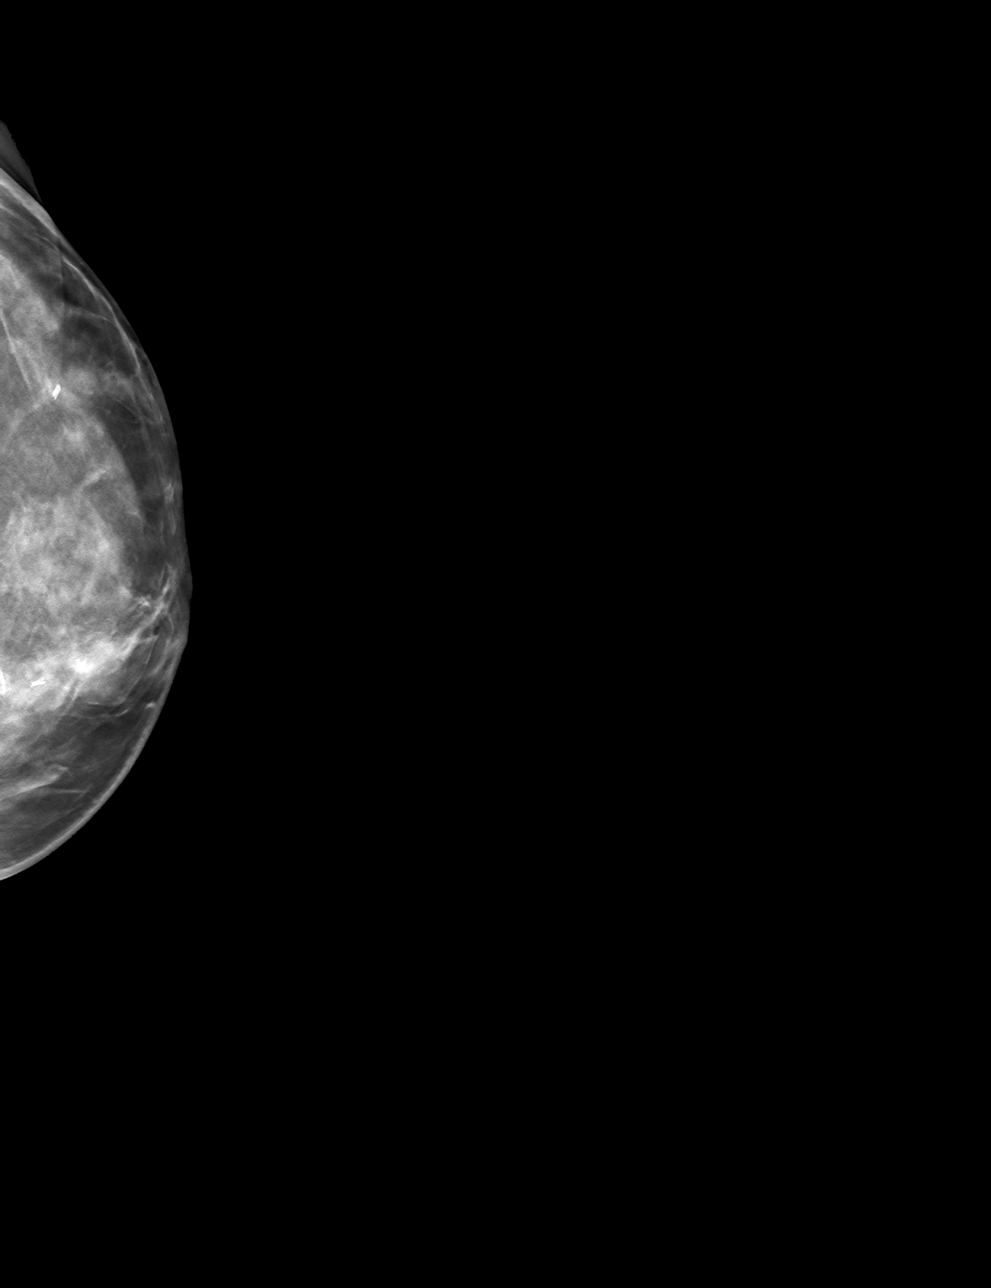

[2 of 10 positions shown; findings below may reference images not displayed]

FINDINGS: Mammographic images were obtained following ultrasound guided biopsy
of the bilateral breasts. Due to the location of the biopsy sites in
the right breast, as well as the patient's pectus excavatum, the 2
biopsy marking clips placed today in the right breast could only be
seen in 1 projection, but appear to be appropriately positioned. The
biopsy marking clips are in the expected positions of biopsy in the
left breast.
IMPRESSION: 1. Appropriate positioning of the wing shaped biopsy marking clip in
the lateral aspect of the right breast.

2. Appropriate positioning of the coil shaped biopsy marking clip in
the inferior aspect of the right breast.

3. Appropriate positioning of the heart shaped biopsy marking clip
at the site of biopsy in the upper inner left breast.

Final Assessment: Post Procedure Mammograms for Marker Placement

## 2019-10-30 IMAGING — US US BREAST BX W LOC DEV 1ST LESION IMG BX SPEC US GUIDE*L*
1 series · 11 of 11 positions shown · non-contrast
Comparison: Previous exam(s).
COMPARISON: Previous exam(s).

Addendum:
CLINICAL DATA: 49-year-old female presenting for 2
ultrasound-guided biopsies of the right breast and 1 of the left
breast.

PROCEDURE:
ULTRASOUND GUIDED BILATERAL BREAST CORE NEEDLE BIOPSY

[Series 1: us breast bx w loc dev 1st lesion img bx spec us g · 0.06mm/px · 11 of 11 slices shown]
[im 1/11]
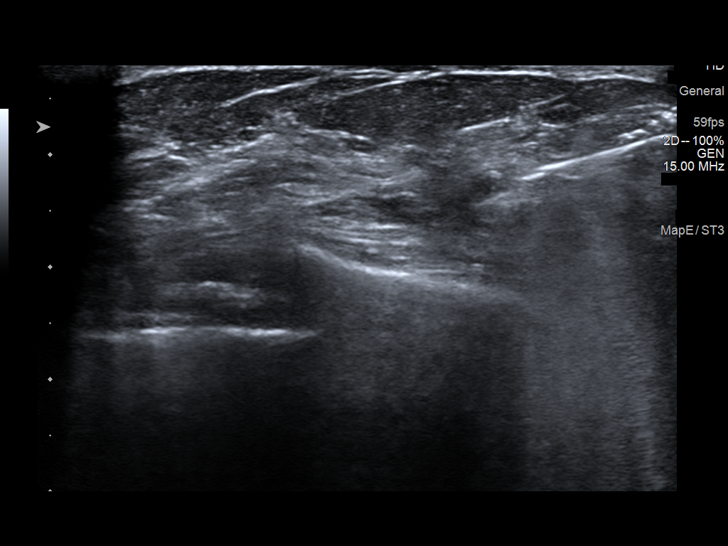
[im 2/11]
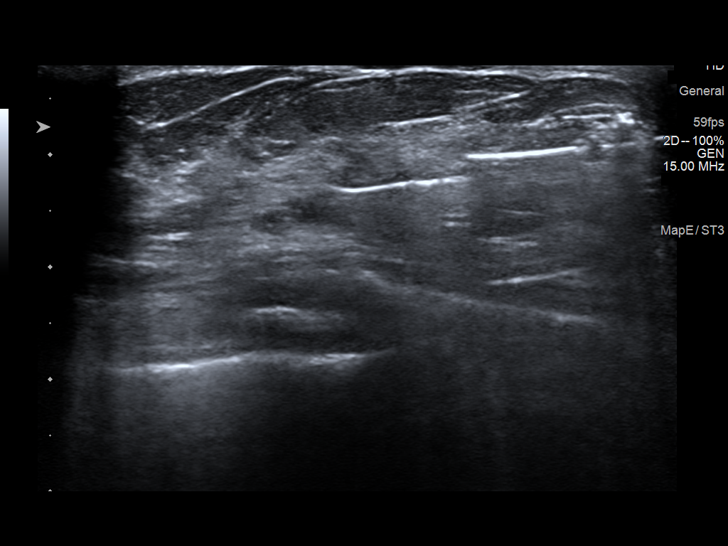
[im 3/11]
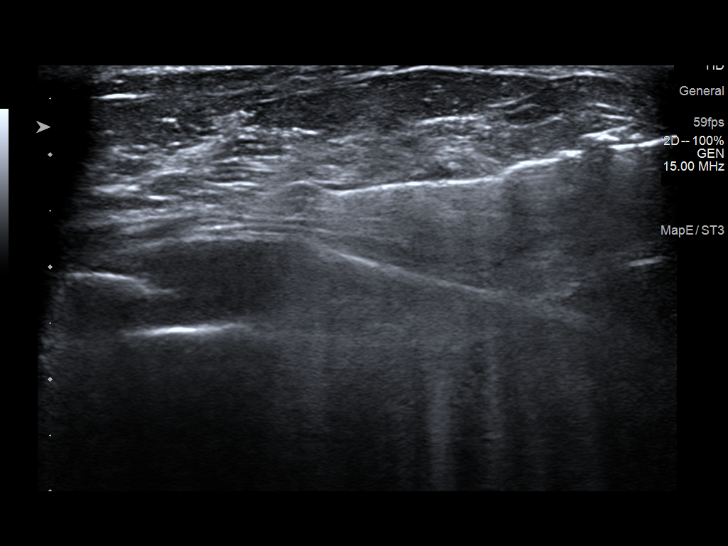
[im 4/11]
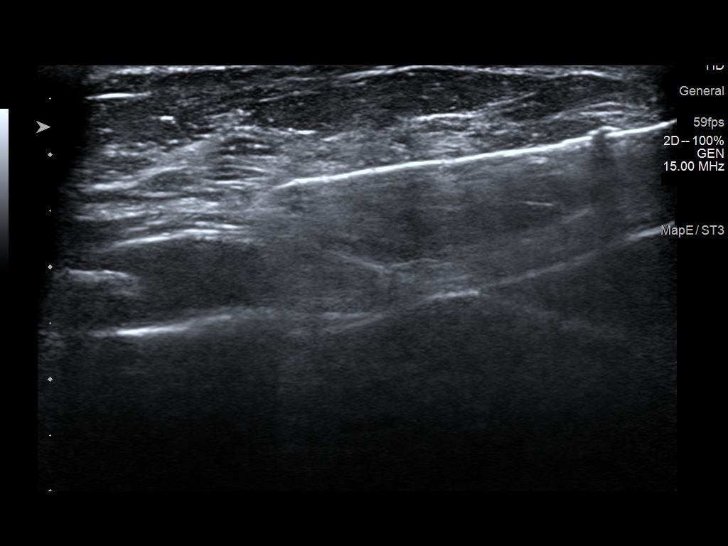
[im 5/11]
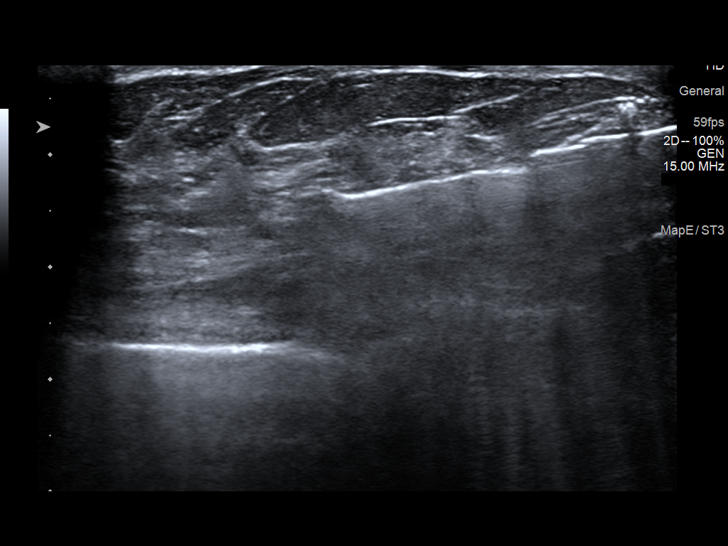
[im 6/11]
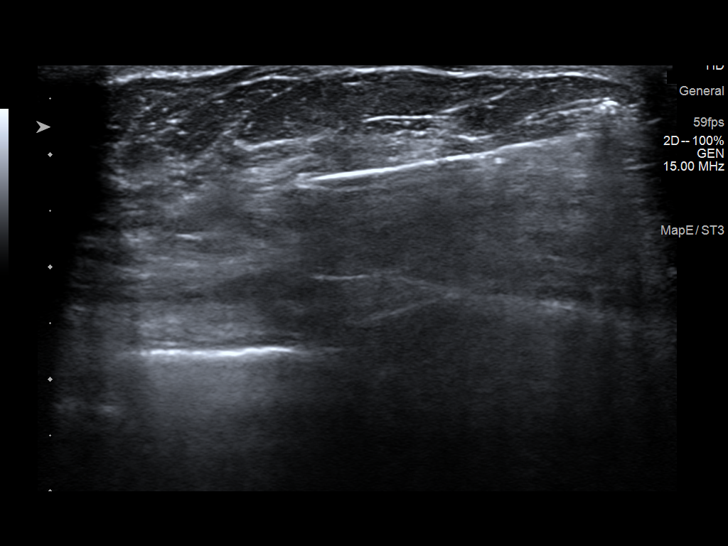
[im 7/11]
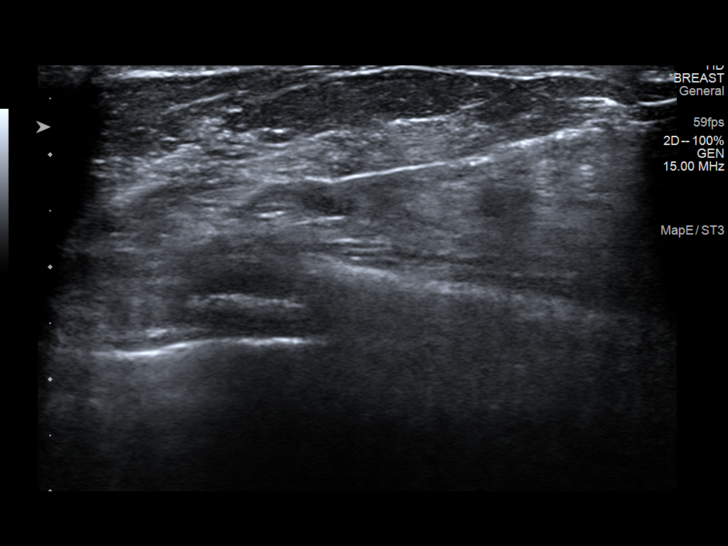
[im 8/11]
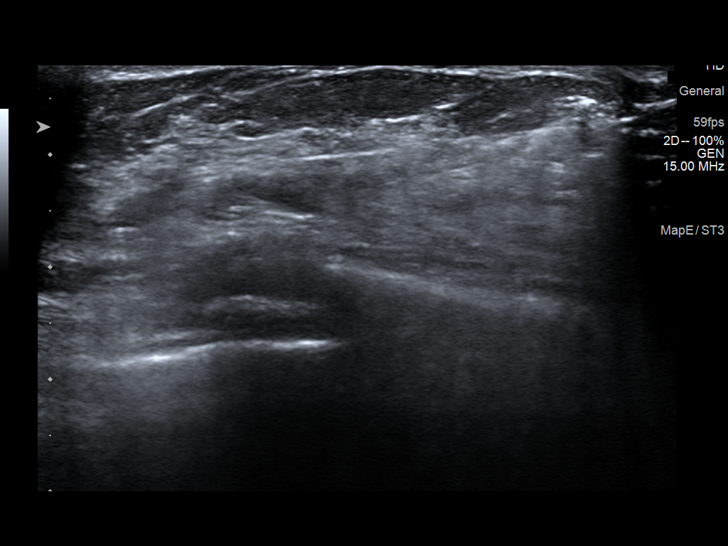
[im 9/11]
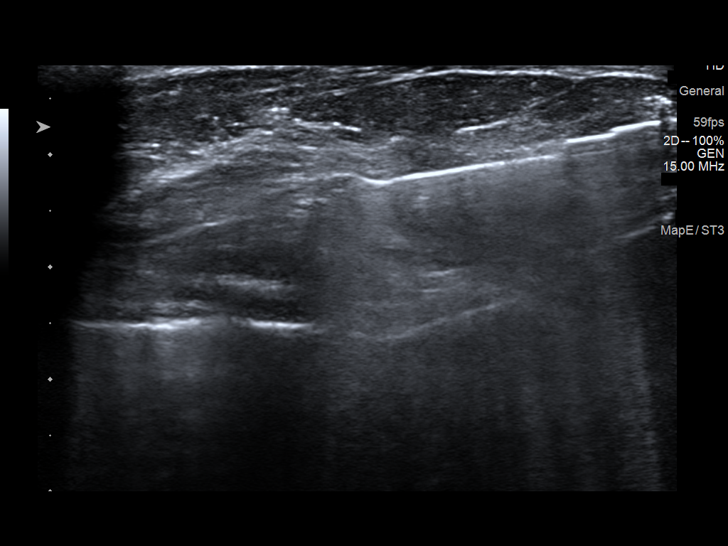
[im 10/11]
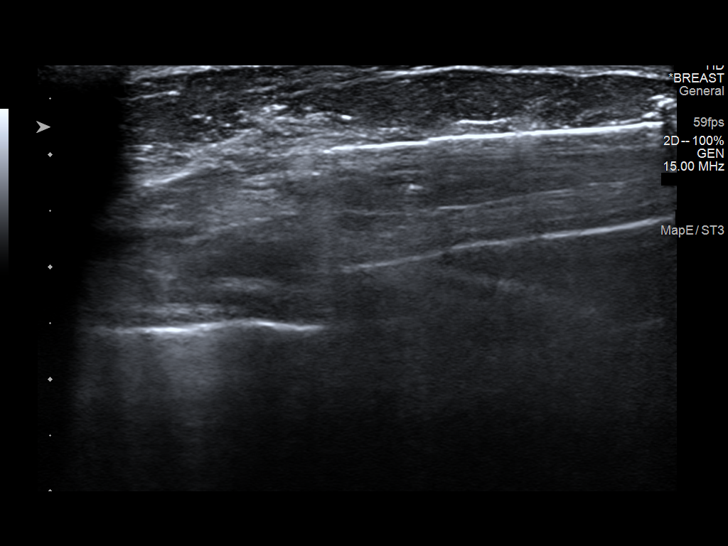
[im 11/11]
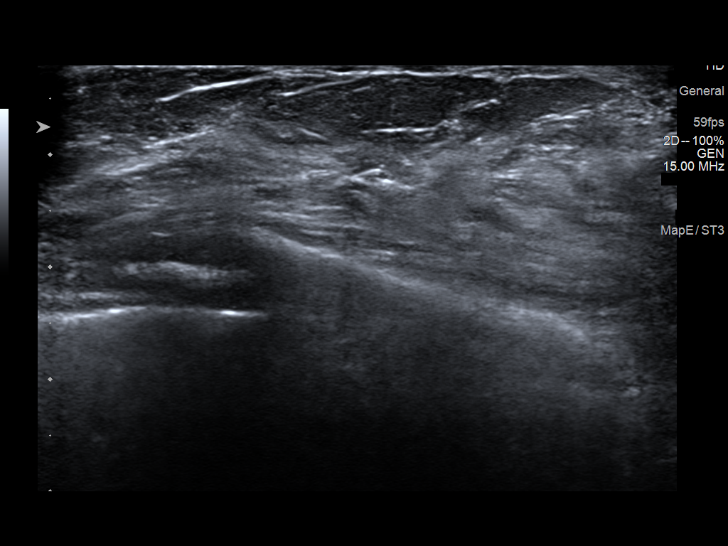

[11 of 11 positions shown; findings below may reference images not displayed]



#1 Lesion quadrant: Lower inner quadrant

Using sterile technique and 1% Lidocaine as local anesthetic, under
direct ultrasound visualization, a 14 gauge SAMSONAITE device was
used to perform biopsy of a mass in the right breast at 5 o'clock
using an inferior approach. At the conclusion of the procedure a
coil shaped tissue marker clip was deployed into the biopsy cavity.

--------------------------------------------------------------------------------------------------------------------------------------------

#2 Lesion quadrant: Upper outer quadrant

Using sterile technique and 1% Lidocaine as local anesthetic, under
direct ultrasound visualization, a 14 gauge SAMSONAITE device was
used to perform biopsy of a mass in the right breast at 10 o'clock
using an inferior approach. At the conclusion of the procedure a
heart shaped tissue marker clip was deployed into the biopsy cavity.

--------------------------------------------------------------------------------------------------------------------------------------------

#3 Lesion quadrant: Upper outer quadrant

Using sterile technique and 1% Lidocaine as local anesthetic, under
direct ultrasound visualization, a 14 gauge SAMSONAITE device was
used to perform biopsy of a mass in the left breast at 10 o'clock
using a superolateral approach. At the conclusion of the procedure a
heart shaped tissue marker clip was deployed into the biopsy cavity.

Follow up 2 view mammogram was performed and dictated separately.
IMPRESSION: 1. Ultrasound guided biopsy of a right breast mass at 5 o'clock
(coil clip). No apparent complications.

2. Ultrasound guided biopsy of a right breast mass at 10 o'clock
(heart clip). No apparent complications.

3. Ultrasound guided biopsy of a left breast mass at 10 o'clock
(heart clip). No apparent complications.

ADDENDUM:
Pathology revealed COMPLEX SCLEROSING LESION WITH USUAL DUCTAL
HYPERPLASIA of the RIGHT breast, 5 o'clock. This was found to be
concordant by Dr. SAMSONAITE, with consultation for possible
excision recommended.

Pathology revealed COMPLEX SCLEROSING LESION WITH USUAL DUCTAL
HYPERPLASIA of the RIGHT breast, 10 o'clock. This was found to be
concordant by Dr. SAMSONAITE, with consultation for possible
excision recommended.

Pathology revealed COMPLEX SCLEROSING LESION WITH USUAL DUCTAL
HYPERPLASIA of the LEFT breast, 10 o'clock. This was found to be
concordant by Dr. SAMSONAITE, with consultation for possible
excision recommended.

Pathology results were discussed with the patient by telephone. The
patient reported doing well after the biopsies with tenderness at
the sites. Post biopsy instructions and care were reviewed and
questions were answered. The patient was encouraged to call The
direct phone number was provided.

The patient has a biopsy proven RIGHT breast complex sclerosing
lesion @ 10:30 o'clock location, and biopsy proven LEFT breast
complex sclerosing lesion x 2, @ 7 o'clock and 1 o'clock locations,
and is under the care of Dr. SAMSONAITE at [REDACTED]. Dr. SAMSONAITE was notified of biopsy results via [REDACTED] message
on [DATE].

Pathology results reported by SAMSONAITE, RN on [DATE].



#1 Lesion quadrant: Lower inner quadrant

Using sterile technique and 1% Lidocaine as local anesthetic, under
direct ultrasound visualization, a 14 gauge SAMSONAITE device was
used to perform biopsy of a mass in the right breast at 5 o'clock
using an inferior approach. At the conclusion of the procedure a
coil shaped tissue marker clip was deployed into the biopsy cavity.

--------------------------------------------------------------------------------------------------------------------------------------------

#2 Lesion quadrant: Upper outer quadrant

Using sterile technique and 1% Lidocaine as local anesthetic, under
direct ultrasound visualization, a 14 gauge SAMSONAITE device was
used to perform biopsy of a mass in the right breast at 10 o'clock
using an inferior approach. At the conclusion of the procedure a
heart shaped tissue marker clip was deployed into the biopsy cavity.

--------------------------------------------------------------------------------------------------------------------------------------------

#3 Lesion quadrant: Upper outer quadrant

Using sterile technique and 1% Lidocaine as local anesthetic, under
direct ultrasound visualization, a 14 gauge SAMSONAITE device was
used to perform biopsy of a mass in the left breast at 10 o'clock
using a superolateral approach. At the conclusion of the procedure a
heart shaped tissue marker clip was deployed into the biopsy cavity.

Follow up 2 view mammogram was performed and dictated separately.
IMPRESSION: 1. Ultrasound guided biopsy of a right breast mass at 5 o'clock
(coil clip). No apparent complications.

2. Ultrasound guided biopsy of a right breast mass at 10 o'clock
(heart clip). No apparent complications.

3. Ultrasound guided biopsy of a left breast mass at 10 o'clock
(heart clip). No apparent complications.

## 2019-11-02 ENCOUNTER — Telehealth: Payer: Self-pay | Admitting: General Surgery

## 2019-11-02 NOTE — Telephone Encounter (Signed)
Discussed patient's pathology and imaging with patient and Dr Thomasena Edis of radiology.  Given small breast size and presence of 3 CSLs per size, excision would leave virtually no breast tissue behind for a benign finding.  It would not be reasonable to do a mastectomy for a complex sclerosing lesion.  Will plan annual MR and mammography with possible ultrasound.

## 2019-11-15 ENCOUNTER — Telehealth: Payer: Self-pay | Admitting: Genetic Counselor

## 2019-11-15 NOTE — Telephone Encounter (Signed)
I received a genetic counseling referral from Dr. Donell Beers for fhx of breast cancer. Felicia Shaffer has been cld and scheduled to see Cari on 12/20 at 1pm. Pt aware to arrive 15 minutes early.

## 2019-12-21 ENCOUNTER — Telehealth: Payer: Self-pay | Admitting: Genetic Counselor

## 2019-12-21 ENCOUNTER — Encounter: Payer: Self-pay | Admitting: Genetic Counselor

## 2019-12-21 NOTE — Telephone Encounter (Signed)
Felicia Shaffer had questions concerning the cost of genetic testing. We discussed that insurance coverage and cost depends largely on family history, and we reviewed her family history briefly.   The only family history of cancer that she knows of is DCIS in her mother, who reportedly had negative genetic testing. Based on this information, Felicia Shaffer would not qualify for insurance coverage of genetic testing unless there is a greater family history (which she denies). The out of pocket cost for genetic testing would be $250 if she would still want to proceed with testing.  We discussed that, even if she does not have genetic testing, genetic counseling can still be important to assess breast cancer risk based on her family history of cancer and personal history of breast biopsies. This can help determine whether additional breast cancer screening may be appropriate, although Felicia Shaffer states that she has already been recommended to have annual breast MRIs.   After some consideration, Felicia Shaffer wished to cancel our genetic counseling appointment. She knows that she can call us back to reschedule her appointment at any point in the future.

## 2019-12-21 NOTE — Telephone Encounter (Signed)
Pt cld to reschedule her genetic counseling appt to 1/18 at 11am. She wanted to know the cost of the lab appt. I transferred her to a genetic counselor to answer this question.

## 2019-12-24 ENCOUNTER — Encounter: Payer: BC Managed Care – PPO | Admitting: Genetic Counselor

## 2019-12-24 ENCOUNTER — Other Ambulatory Visit: Payer: BC Managed Care – PPO

## 2020-01-22 ENCOUNTER — Encounter: Payer: BC Managed Care – PPO | Admitting: Genetic Counselor

## 2020-07-22 ENCOUNTER — Other Ambulatory Visit: Payer: Self-pay | Admitting: General Surgery

## 2020-07-22 DIAGNOSIS — Z1231 Encounter for screening mammogram for malignant neoplasm of breast: Secondary | ICD-10-CM

## 2020-08-07 ENCOUNTER — Other Ambulatory Visit: Payer: Self-pay

## 2020-08-07 ENCOUNTER — Ambulatory Visit
Admission: RE | Admit: 2020-08-07 | Discharge: 2020-08-07 | Disposition: A | Discharge status: Home / Self Care | Payer: BC Managed Care – PPO | Source: Ambulatory Visit | Attending: General Surgery | Admitting: General Surgery

## 2020-08-07 DIAGNOSIS — Z1231 Encounter for screening mammogram for malignant neoplasm of breast: Secondary | ICD-10-CM

## 2020-08-07 IMAGING — MG MM DIGITAL SCREENING BILAT W/ TOMO AND CAD
8 series · 9 of 24 positions shown · non-contrast
Comparison: Previous exam(s).

CLINICAL DATA: Screening.

EXAM:
DIGITAL SCREENING BILATERAL MAMMOGRAM WITH TOMOSYNTHESIS AND CAD
TECHNIQUE: Bilateral screening digital craniocaudal and mediolateral oblique
mammograms were obtained. Bilateral screening digital breast
tomosynthesis was performed. The images were evaluated with
computer-aided detection.

[R CC synth-2D]
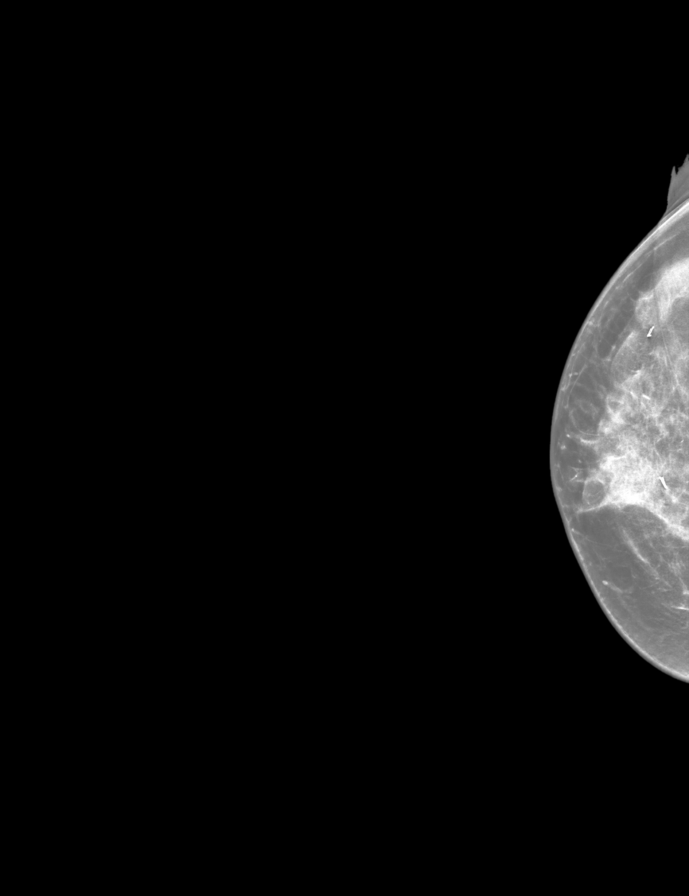

[L MLO synth-2D]
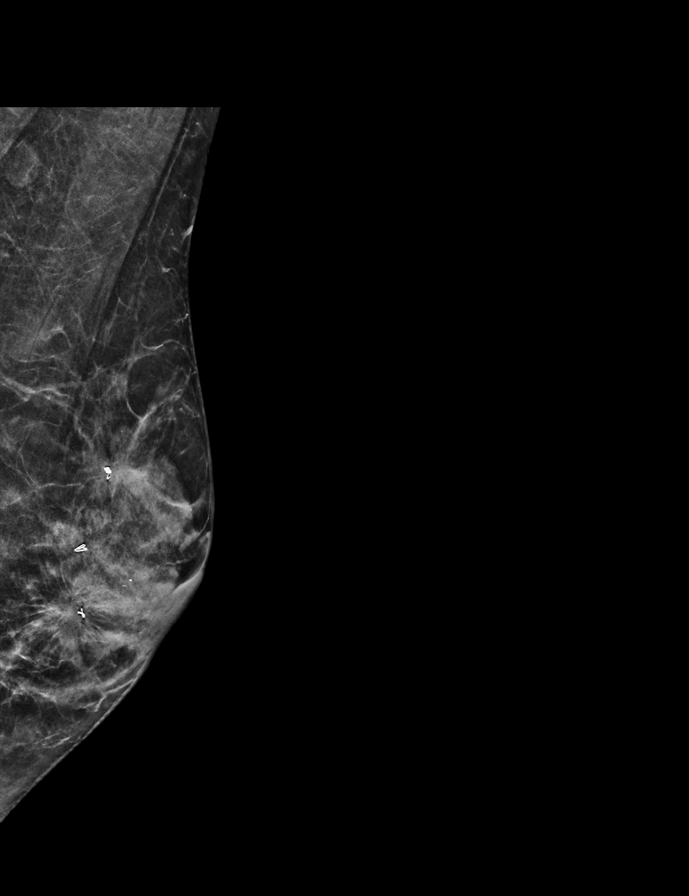

[L CC synth-2D]
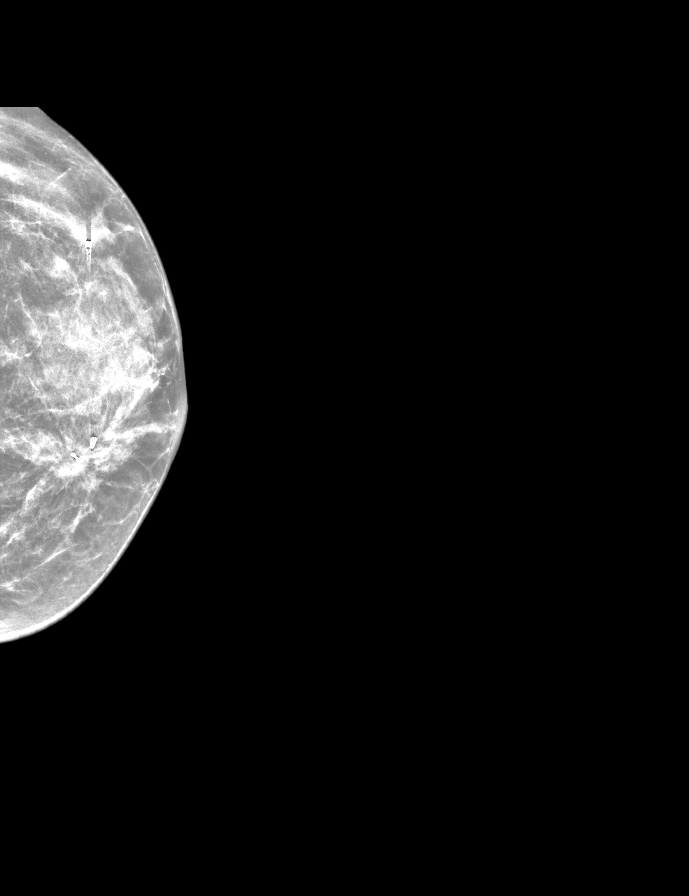

[R MLO synth-2D]
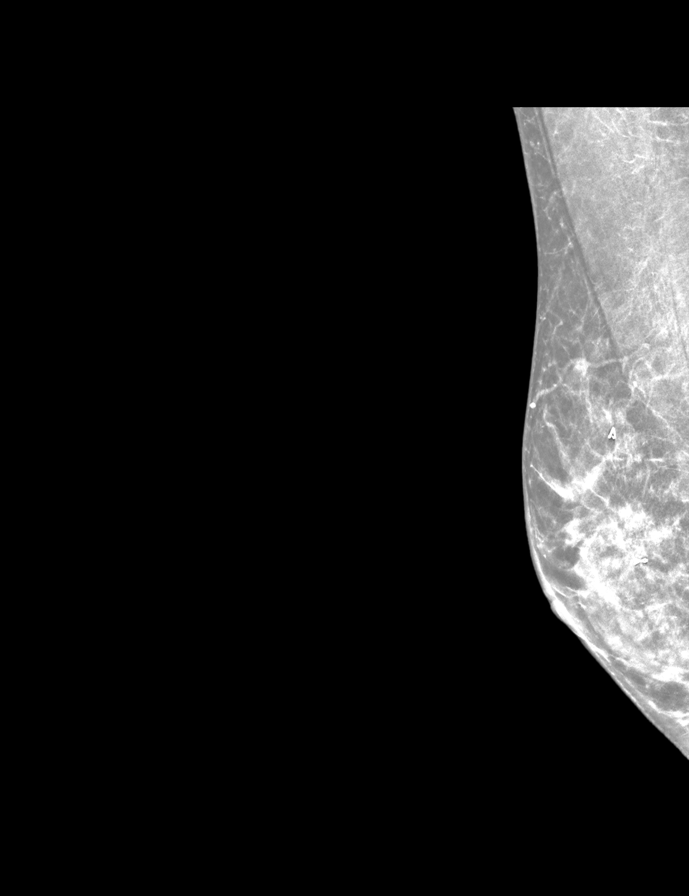

[L MLO tomo · 2 of 43 frames shown]
[frame 14/43]
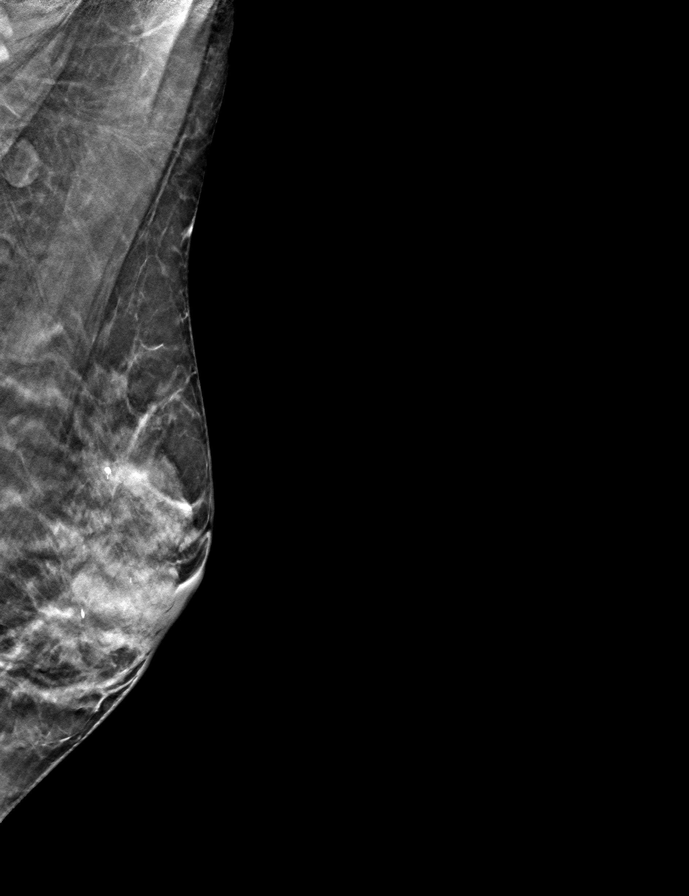
[frame 22/43]
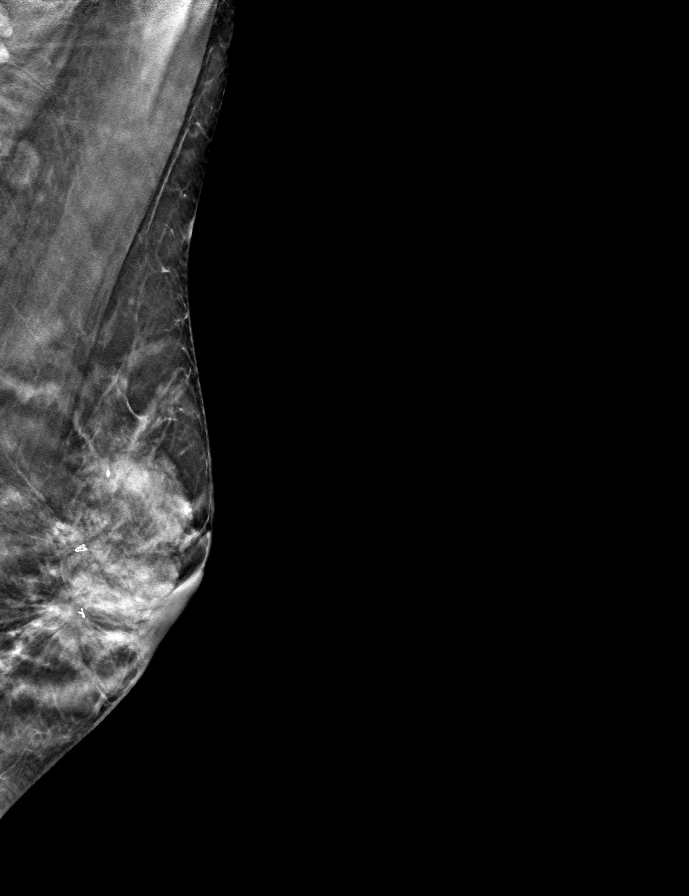

[R MLO tomo · tomo slice 21/40.0]
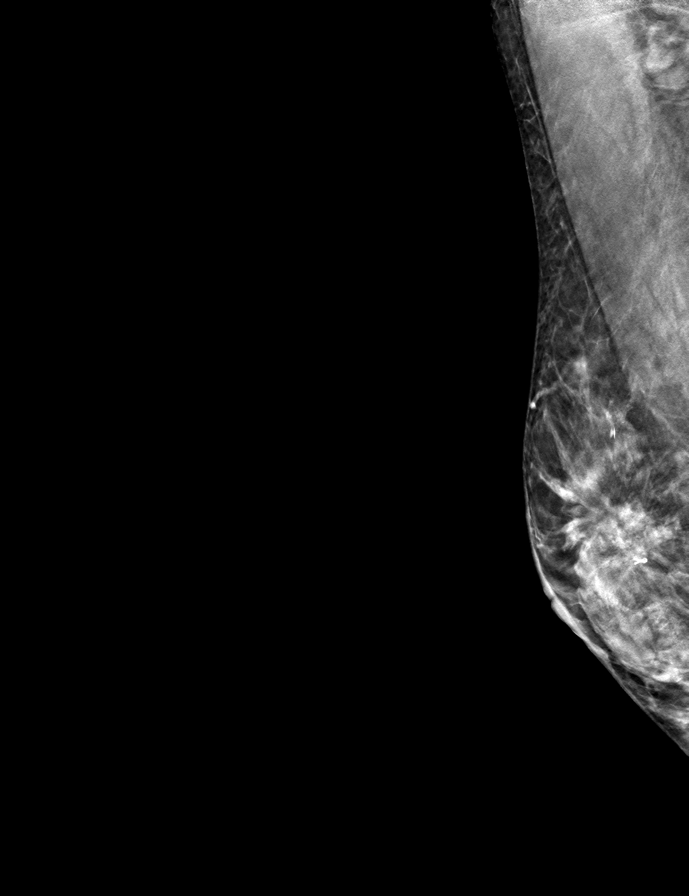

[R CC tomo · tomo slice 25/48.0]
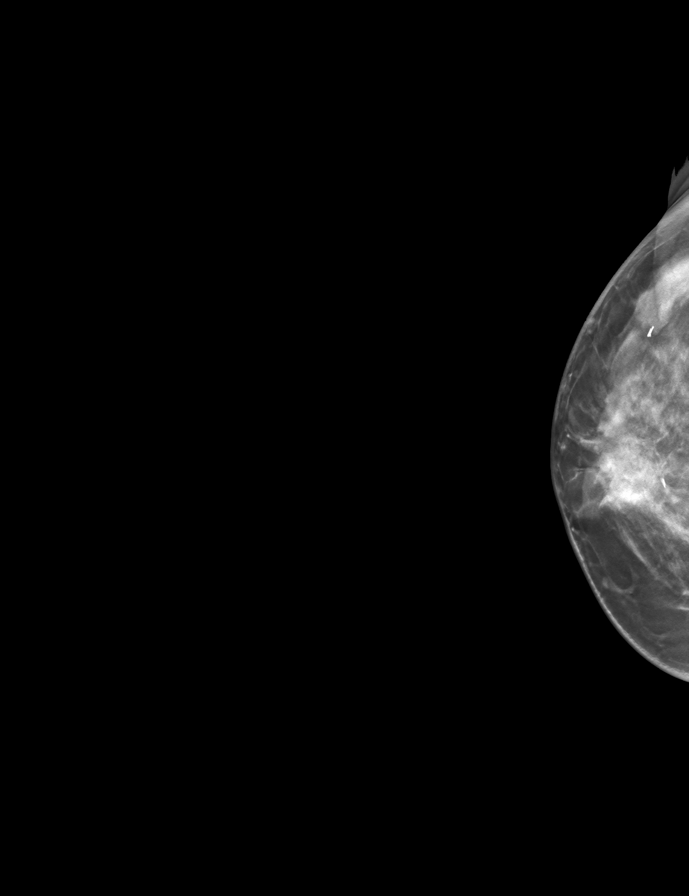

[L CC tomo · tomo slice 21/42.0]
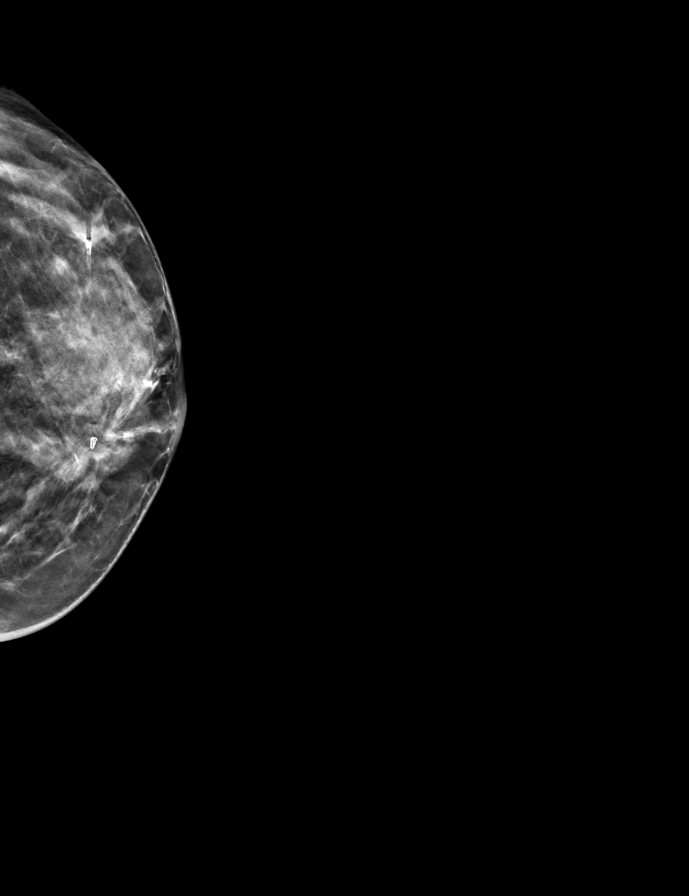

[9 of 24 positions shown; findings below may reference images not displayed]

ACR Breast Density Category c: The breast tissue is heterogeneously
dense, which may obscure small masses.
FINDINGS: There are possible new areas of distortion in the bilateral breasts.
The patient also has previously biopsied abnormalities in the
bilateral breasts which for previously recommended for excision.
IMPRESSION: Possible bilateral breast distortion. The patient also has biopsy
proven bilateral complex sclerosing lesions which were previously
recommended for surgical excision.

RECOMMENDATION:
Bilateral diagnostic mammogram and possible bilateral ultrasound.

The patient will be contacted regarding the findings, and additional
imaging will be scheduled.

BI-RADS CATEGORY  0: Incomplete. Need additional imaging evaluation
and/or prior mammograms for comparison.

## 2020-08-12 ENCOUNTER — Other Ambulatory Visit: Payer: Self-pay | Admitting: General Surgery

## 2020-08-12 DIAGNOSIS — R928 Other abnormal and inconclusive findings on diagnostic imaging of breast: Secondary | ICD-10-CM

## 2020-09-16 ENCOUNTER — Ambulatory Visit
Admission: RE | Admit: 2020-09-16 | Discharge: 2020-09-16 | Disposition: A | Discharge status: Home / Self Care | Payer: BC Managed Care – PPO | Source: Ambulatory Visit | Attending: General Surgery | Admitting: General Surgery

## 2020-09-16 ENCOUNTER — Other Ambulatory Visit: Payer: Self-pay | Admitting: General Surgery

## 2020-09-16 ENCOUNTER — Other Ambulatory Visit: Payer: Self-pay

## 2020-09-16 DIAGNOSIS — R928 Other abnormal and inconclusive findings on diagnostic imaging of breast: Secondary | ICD-10-CM

## 2020-09-16 DIAGNOSIS — N6310 Unspecified lump in the right breast, unspecified quadrant: Secondary | ICD-10-CM

## 2020-09-16 IMAGING — MG DIGITAL DIAGNOSTIC BILAT W/ TOMO W/ CAD
6 of 10 series · 6 of 30 positions shown · non-contrast
Comparison: Previous exam(s).

CLINICAL DATA: Patient with history of bilateral breast biopsies
demonstrating complex sclerosing lesions. The lesions have not been
excised at this time. Patient also has had a prior breast MRI.
Patient for further evaluation of areas of distortion within the
right and left breast.



[L CC synth-2D]
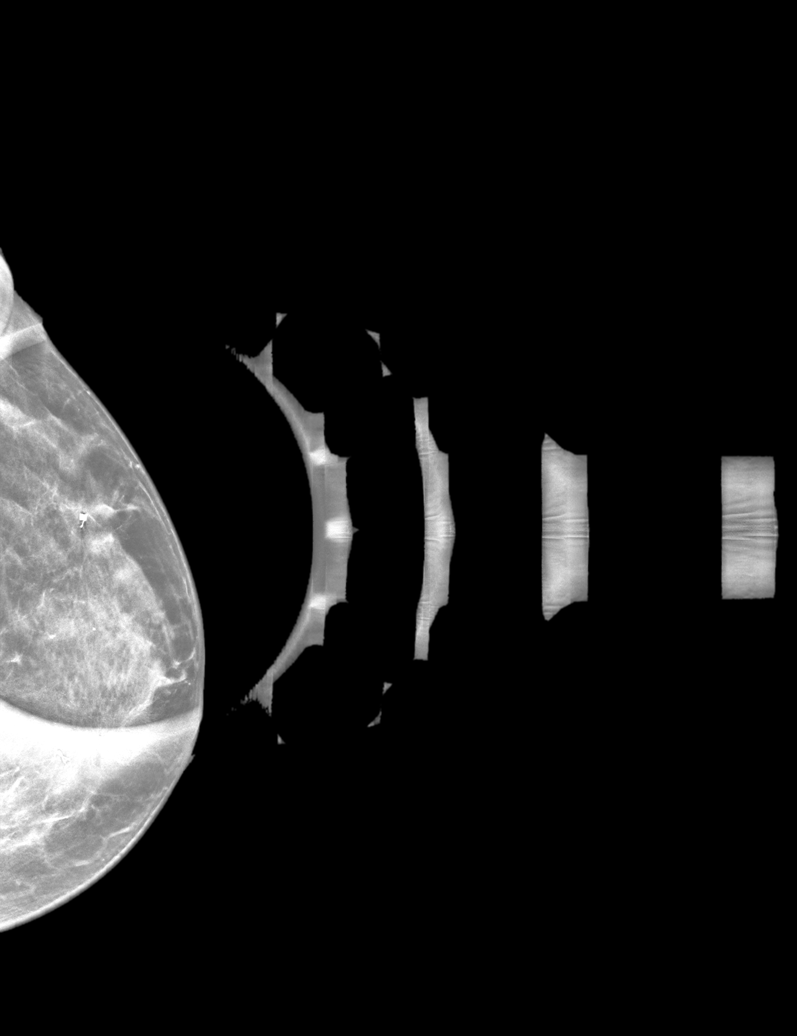

[R MLO synth-2D]
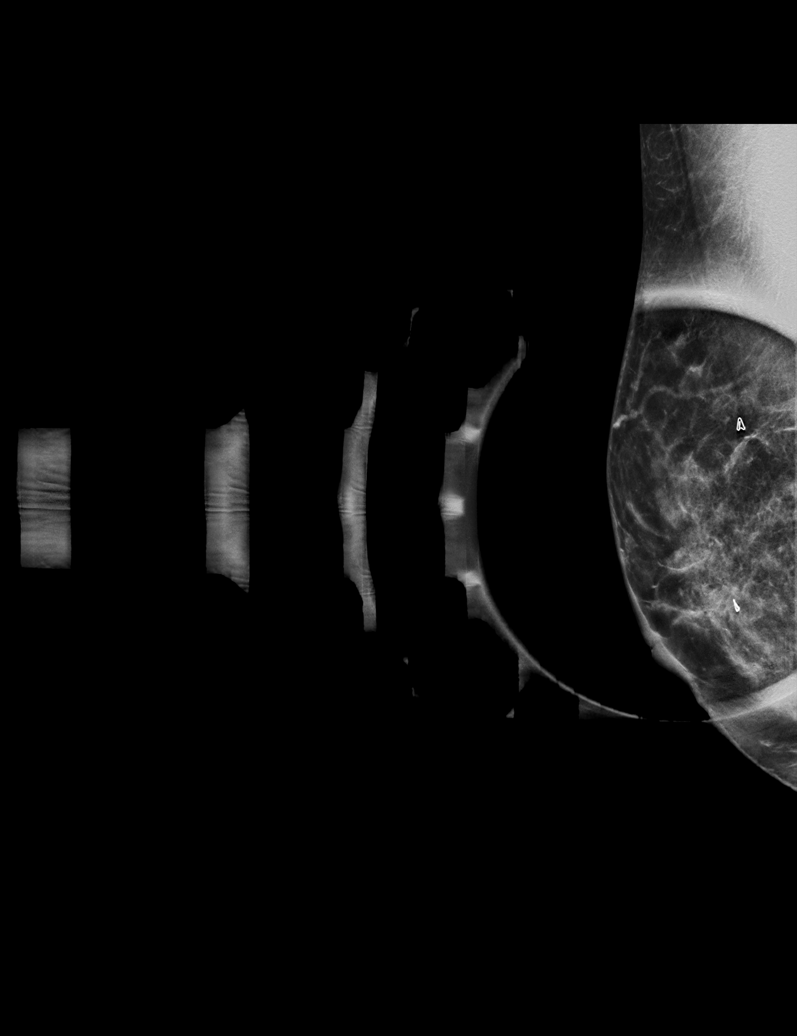

[L ML synth-2D]
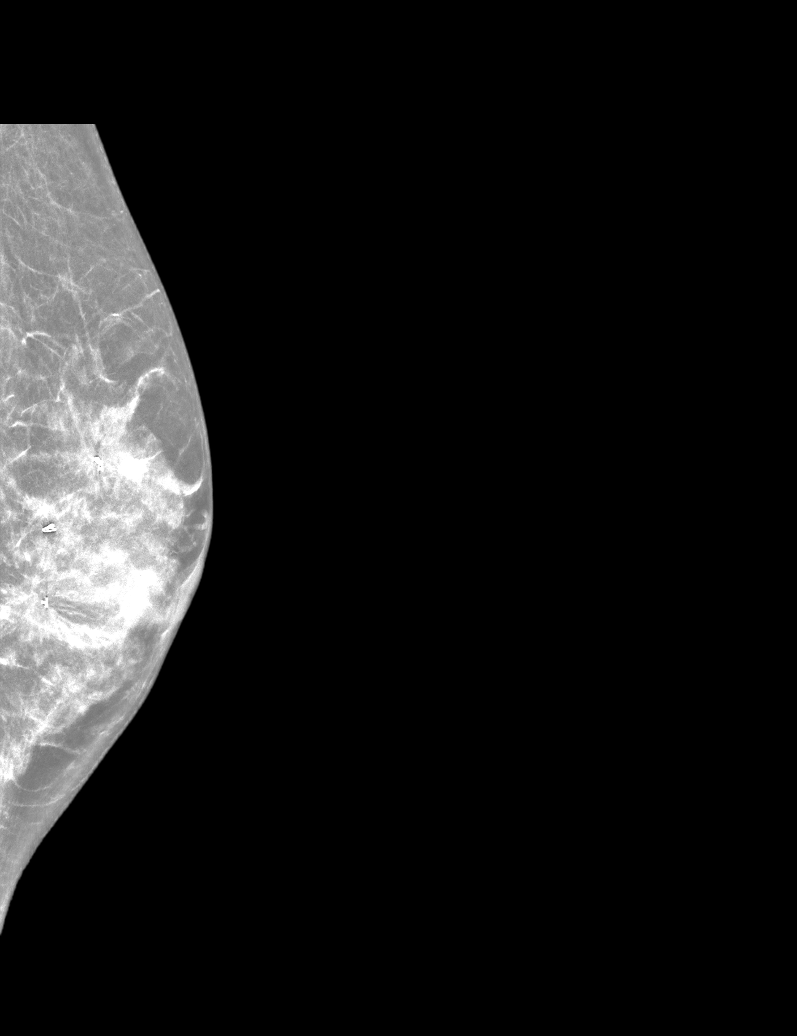

[L MLO synth-2D]
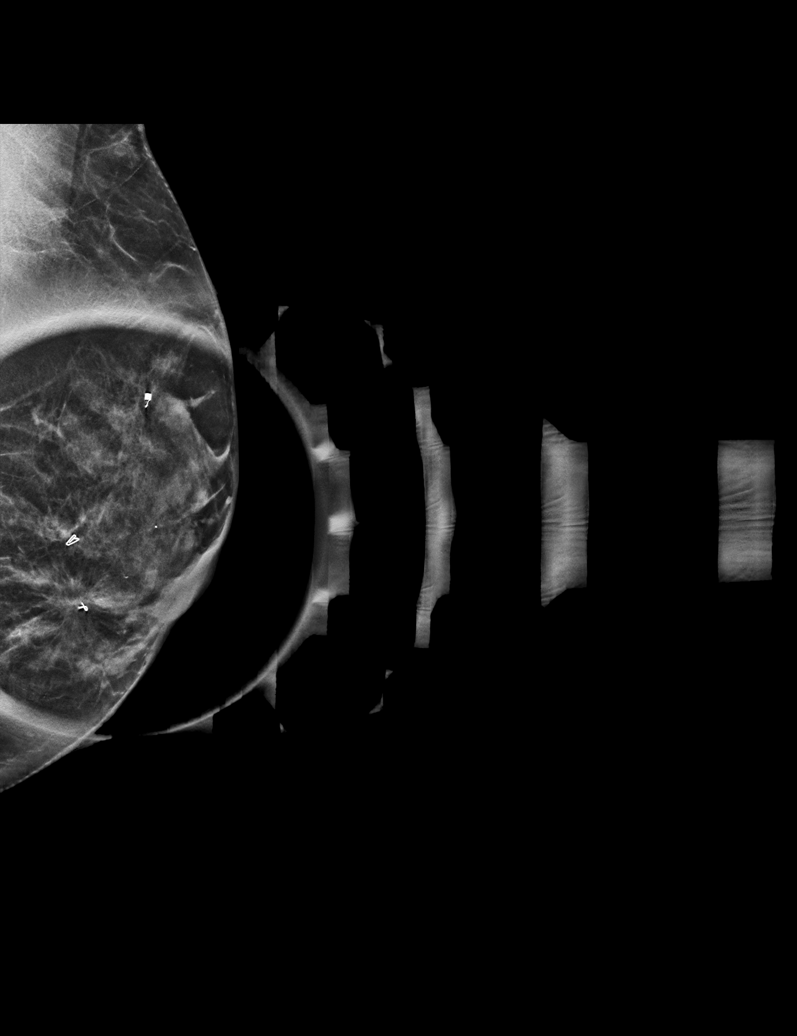

[R ML synth-2D]
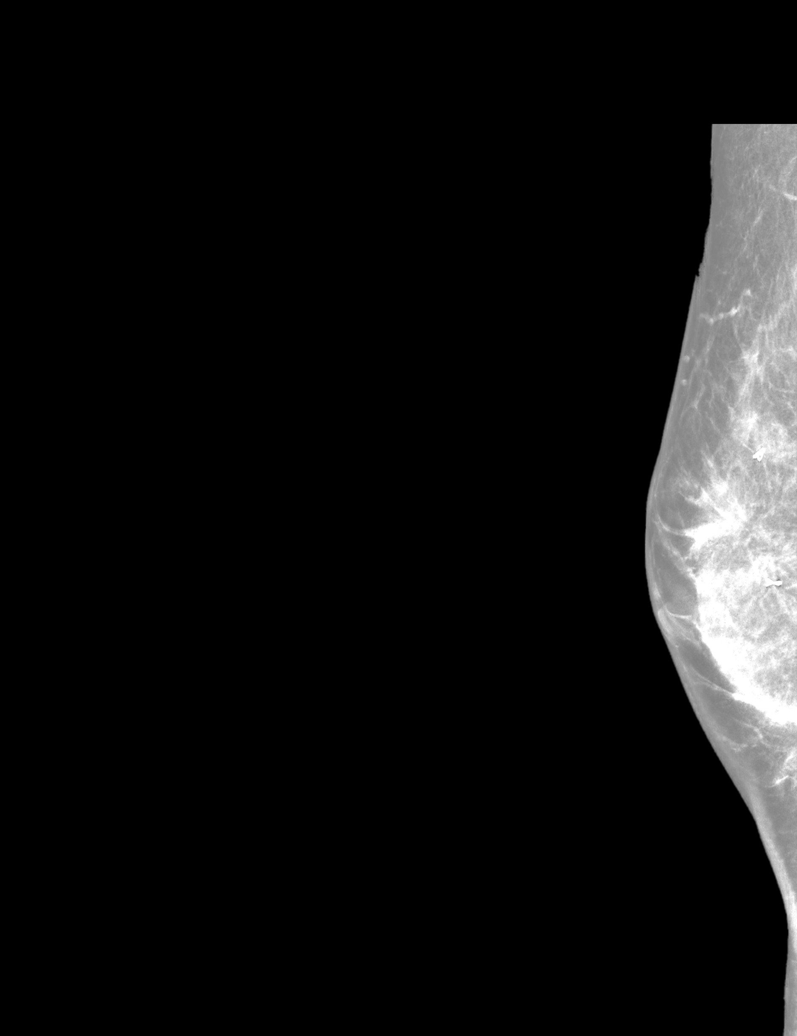

[R ML tomo · tomo slice 25/49.0]
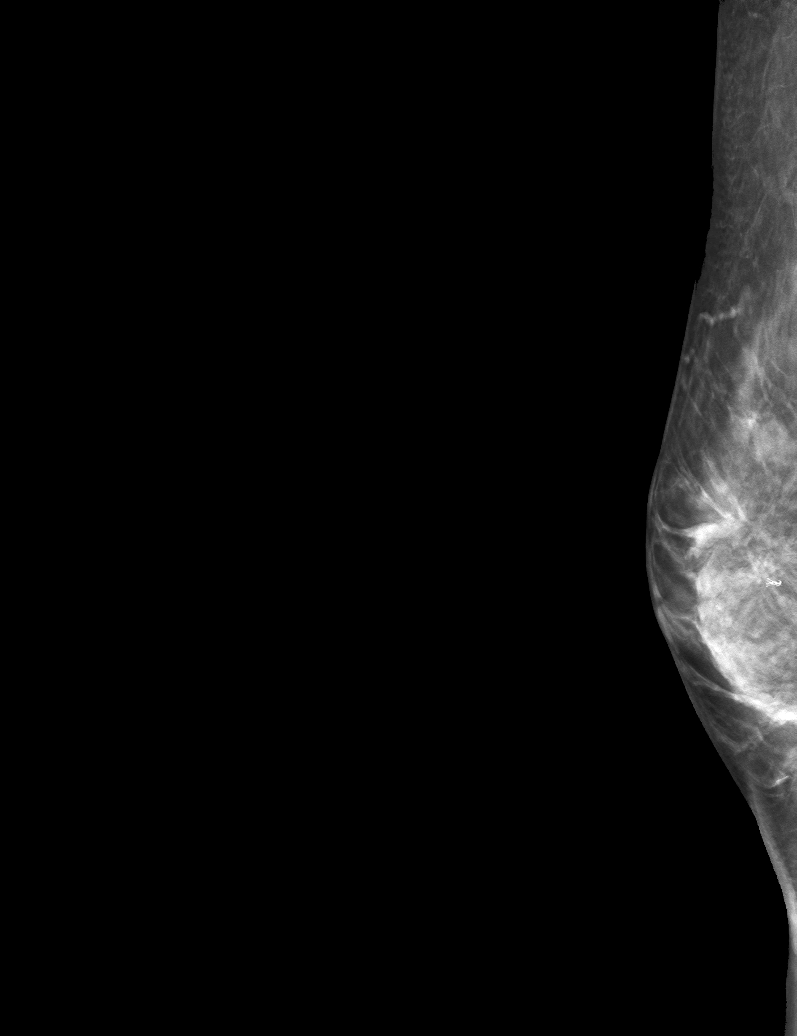

[6 of 30 positions shown; findings below may reference images not displayed]

ACR Breast Density Category c: The breast tissue is heterogeneously
dense, which may obscure small masses.
FINDINGS: Additional imaging of the right and left breast was obtained. There
is extensive distortion identified throughout the breasts
bilaterally both on the full paddle and spot compression views.
Three biopsy marking clips are demonstrated within the left breast
and two biopsy marking clips are demonstrated within the right
breast. There is a fairly large mass associated within the lower
inner left breast at the site of ribbon shaped marking clip.

Targeted ultrasound is performed, showing a 1.5 x 1.1 x 1.0 cm
irregular mass left breast 7 o'clock position 1 cm from the nipple
containing a biopsy marking clip.

Within the left breast 3 o'clock position 2 cm from the nipple there
is a 0.7 x 0.4 x 0.8 cm irregular hypoechoic mass.

No left axillary adenopathy.

Within the right breast 12 o'clock position 1 cm from the nipple
there is a 3.0 x 1.5 x 1.5 cm irregular masslike area of mixed
echogenicity with shadowing.

No right axillary adenopathy.
IMPRESSION: 1. Patient has multiple bilateral complex sclerosing lesions which
have been previously biopsied. There is extensive distortion
demonstrated throughout the breasts bilaterally, making evaluation
for any new findings or changes from prior exam difficult given the
extensive nature of the underlying background parenchyma and
multiple areas of distortion.
2. There is a new mass within the left breast 3 o'clock position 2
cm from the nipple.
3. There is a large suspicious area within the right breast 12
o'clock position 1 cm from the nipple.

RECOMMENDATION:
1. Recommend ultrasound-guided core needle biopsy of the left breast
mass 3 o'clock position 2 cm from the nipple.
2. Retro min ultrasound-guided core needle biopsy of the right
breast 12 o'clock position 1 cm from the nipple.
3. After both of these biopsies, particularly if they both
demonstrate complex sclerosing lesions, patient will need surgical
consultation with Dr. UNIKATNI BOZICNI. I have personally spoken with Dr.
UNIKATNI BOZICNI about this case at the time of diagnostic exam. Additionally,
patient would benefit from bilateral breast MRI. Given that these
complex sclerosing lesions all have sonographic correlates, excision
should be strongly considered. If not all of the lesions are going
to be excised, the most suspicious lesion at this time is the left
breast 7 o'clock position.

I have discussed the findings and recommendations with the patient.
If applicable, a reminder letter will be sent to the patient
regarding the next appointment.

BI-RADS CATEGORY  4: Suspicious.

## 2020-09-16 IMAGING — US US BREAST*R* LIMITED INC AXILLA
1 series · 5 of 5 positions shown · non-contrast
Comparison: Previous exam(s).

CLINICAL DATA: Patient with history of bilateral breast biopsies
demonstrating complex sclerosing lesions. The lesions have not been
excised at this time. Patient also has had a prior breast MRI.
Patient for further evaluation of areas of distortion within the
right and left breast.



[Series 1: us breast*right* limited inc axilla · 0.06mm/px · 5 of 5 slices shown]
[im 1/5]
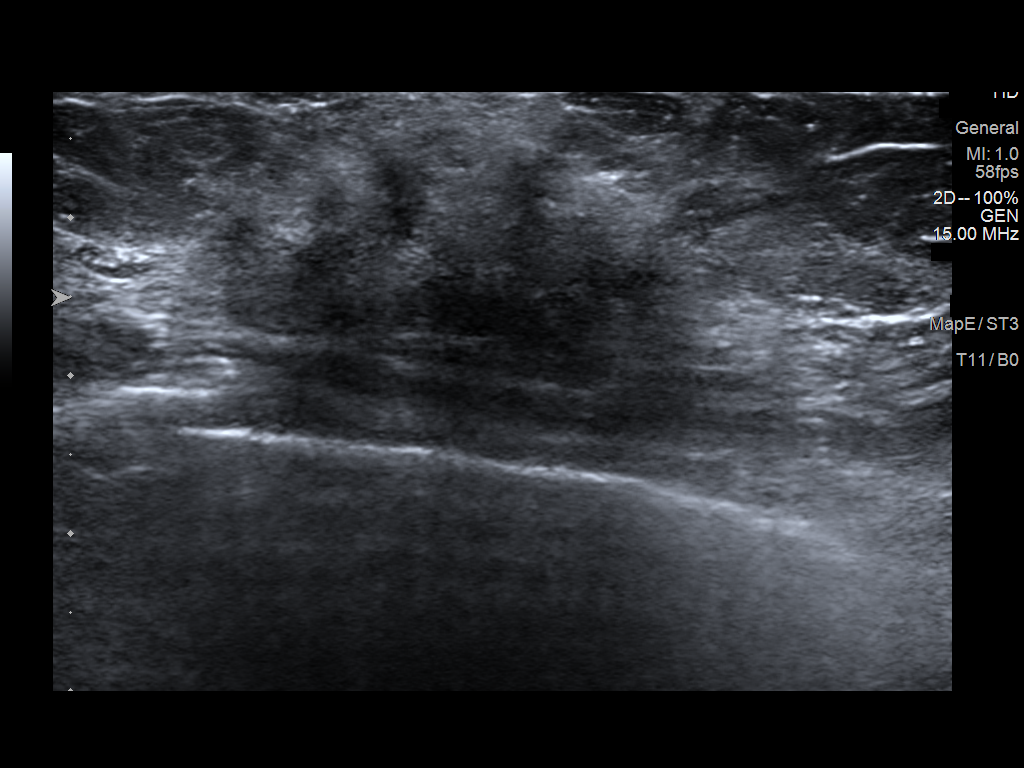
[im 2/5]
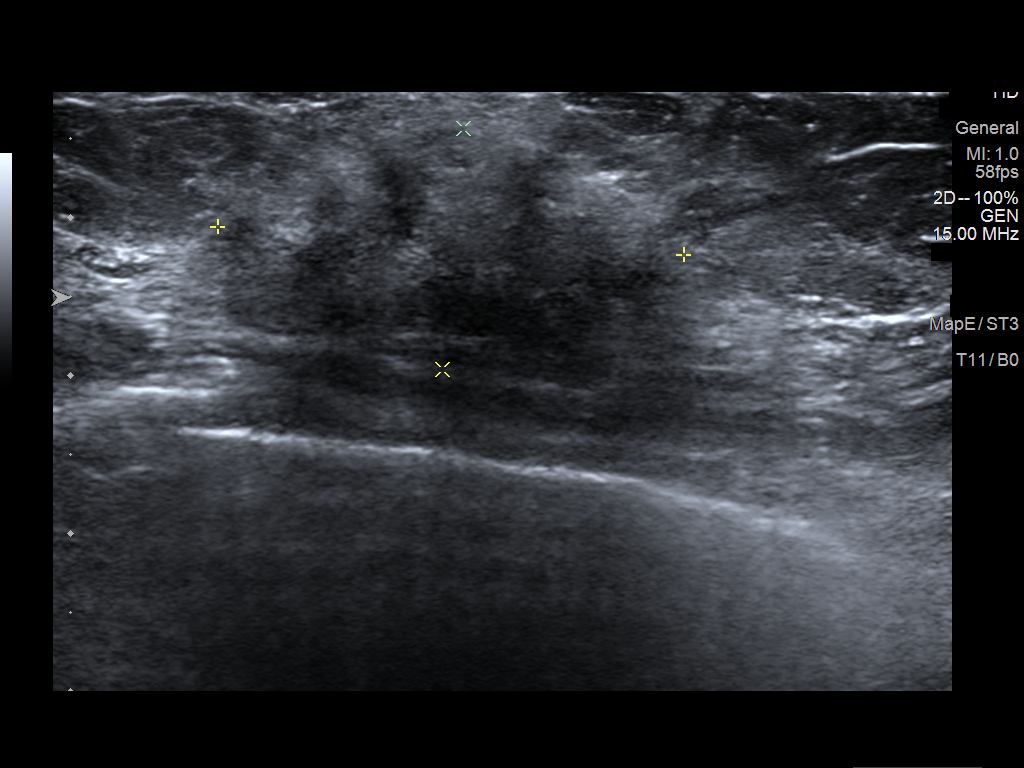
[im 3/5]
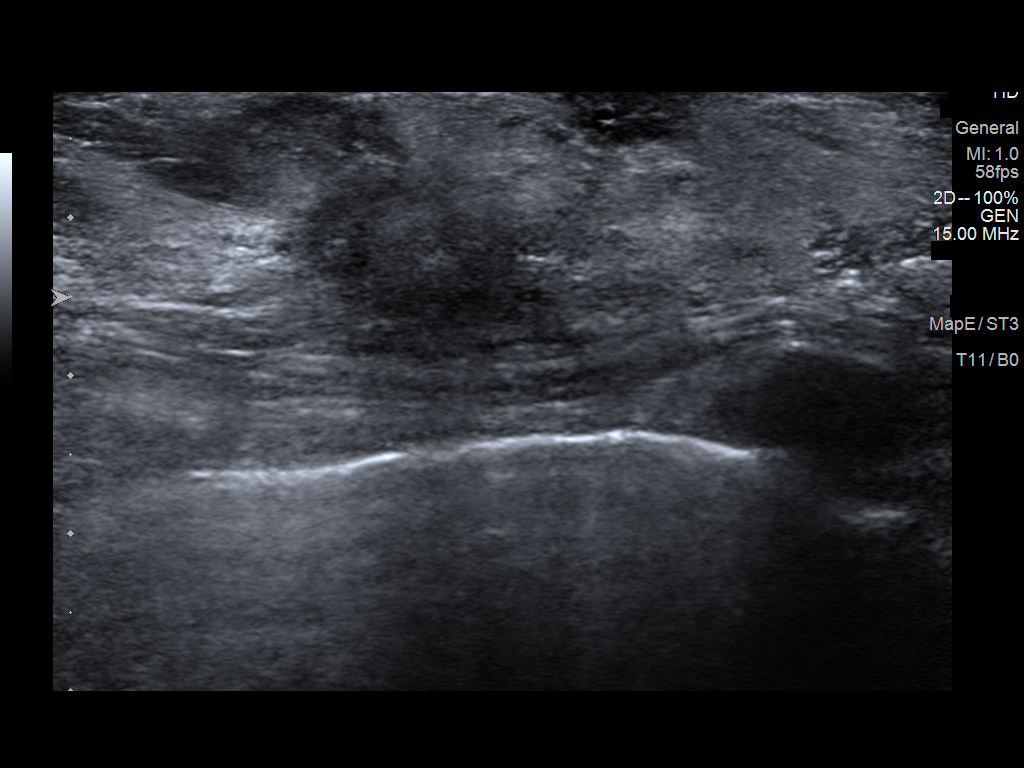
[im 4/5]
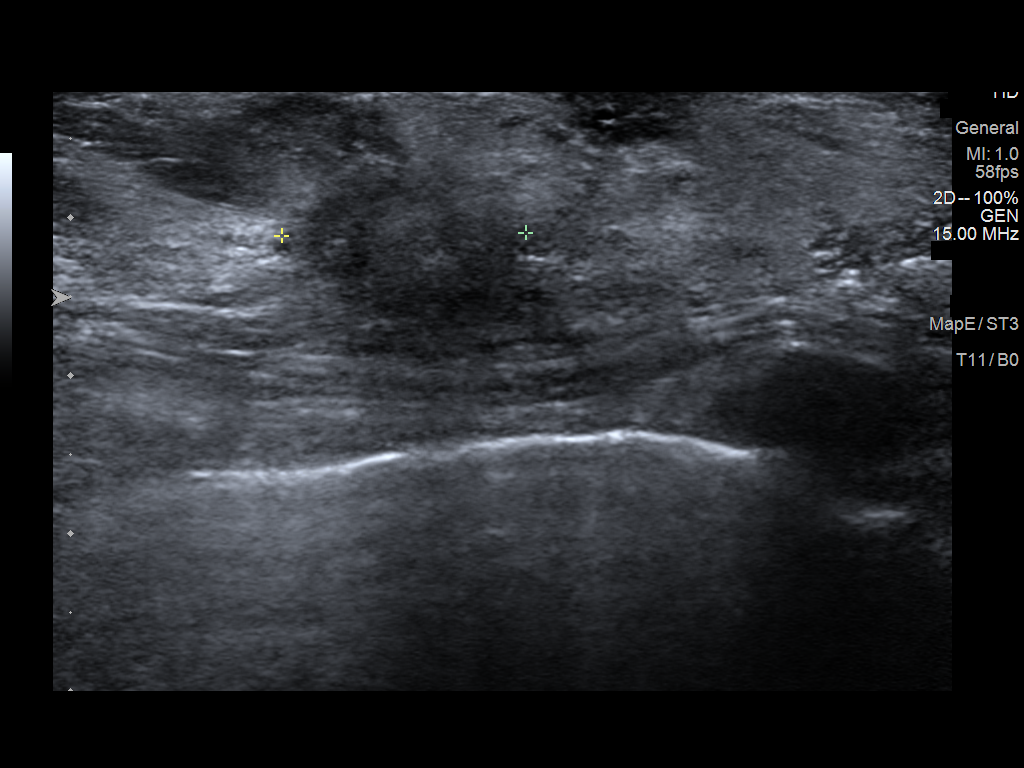
[im 5/5]
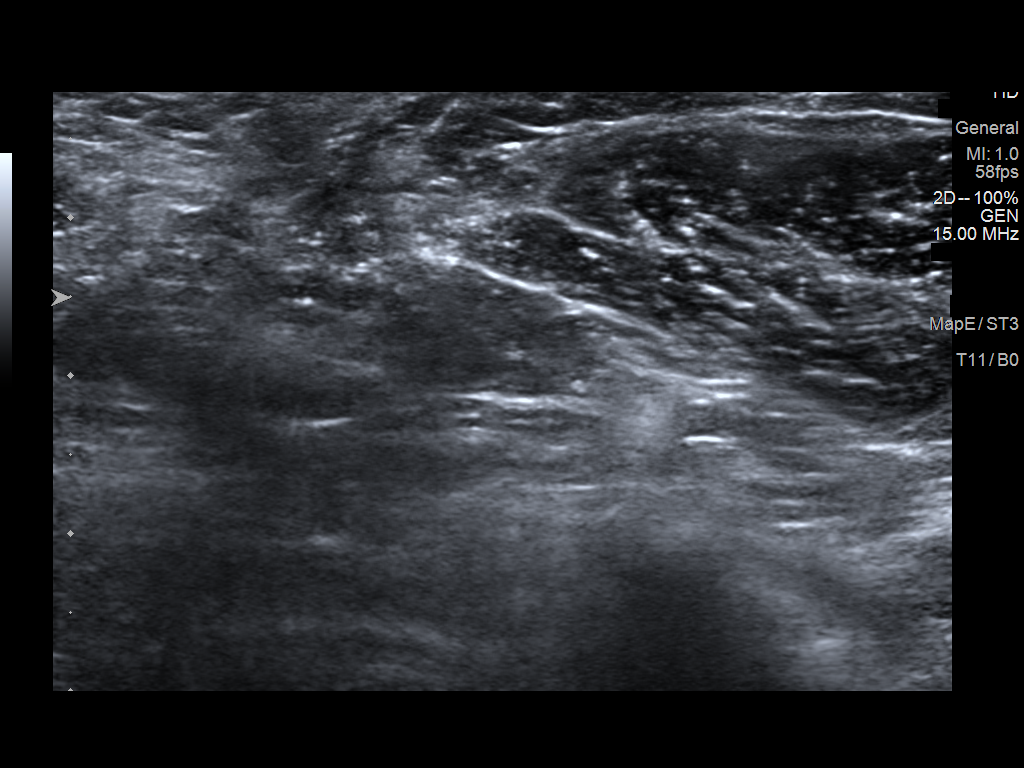

[5 of 5 positions shown; findings below may reference images not displayed]

ACR Breast Density Category c: The breast tissue is heterogeneously
dense, which may obscure small masses.
FINDINGS: Additional imaging of the right and left breast was obtained. There
is extensive distortion identified throughout the breasts
bilaterally both on the full paddle and spot compression views.
Three biopsy marking clips are demonstrated within the left breast
and two biopsy marking clips are demonstrated within the right
breast. There is a fairly large mass associated within the lower
inner left breast at the site of ribbon shaped marking clip.

Targeted ultrasound is performed, showing a 1.5 x 1.1 x 1.0 cm
irregular mass left breast 7 o'clock position 1 cm from the nipple
containing a biopsy marking clip.

Within the left breast 3 o'clock position 2 cm from the nipple there
is a 0.7 x 0.4 x 0.8 cm irregular hypoechoic mass.

No left axillary adenopathy.

Within the right breast 12 o'clock position 1 cm from the nipple
there is a 3.0 x 1.5 x 1.5 cm irregular masslike area of mixed
echogenicity with shadowing.

No right axillary adenopathy.
IMPRESSION: 1. Patient has multiple bilateral complex sclerosing lesions which
have been previously biopsied. There is extensive distortion
demonstrated throughout the breasts bilaterally, making evaluation
for any new findings or changes from prior exam difficult given the
extensive nature of the underlying background parenchyma and
multiple areas of distortion.
2. There is a new mass within the left breast 3 o'clock position 2
cm from the nipple.
3. There is a large suspicious area within the right breast 12
o'clock position 1 cm from the nipple.

RECOMMENDATION:
1. Recommend ultrasound-guided core needle biopsy of the left breast
mass 3 o'clock position 2 cm from the nipple.
2. Retro min ultrasound-guided core needle biopsy of the right
breast 12 o'clock position 1 cm from the nipple.
3. After both of these biopsies, particularly if they both
demonstrate complex sclerosing lesions, patient will need surgical
consultation with Dr. UNIKATNI BOZICNI. I have personally spoken with Dr.
UNIKATNI BOZICNI about this case at the time of diagnostic exam. Additionally,
patient would benefit from bilateral breast MRI. Given that these
complex sclerosing lesions all have sonographic correlates, excision
should be strongly considered. If not all of the lesions are going
to be excised, the most suspicious lesion at this time is the left
breast 7 o'clock position.

I have discussed the findings and recommendations with the patient.
If applicable, a reminder letter will be sent to the patient
regarding the next appointment.

BI-RADS CATEGORY  4: Suspicious.

## 2020-09-16 IMAGING — US US BREAST*L* LIMITED INC AXILLA
1 series · 10 of 10 positions shown · non-contrast
Comparison: Previous exam(s).

CLINICAL DATA: Patient with history of bilateral breast biopsies
demonstrating complex sclerosing lesions. The lesions have not been
excised at this time. Patient also has had a prior breast MRI.
Patient for further evaluation of areas of distortion within the
right and left breast.



[Series 1: us breast*left* limited inc axilla · 0.06mm/px · 10 of 10 slices shown]
[im 1/10]
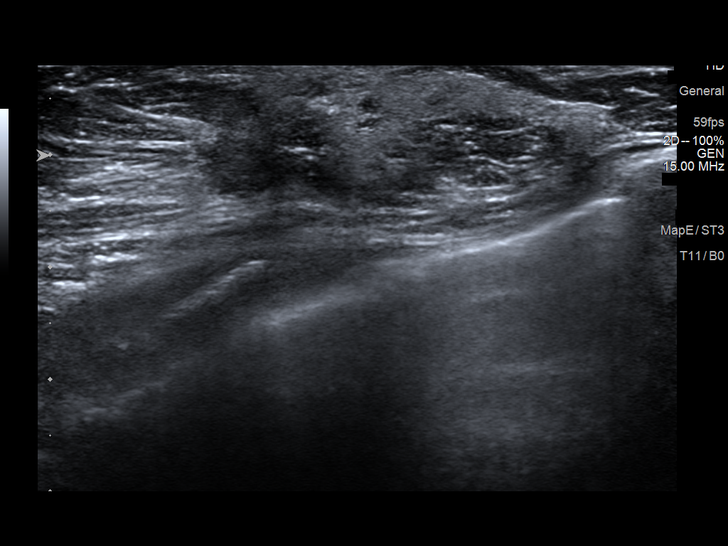
[im 2/10]
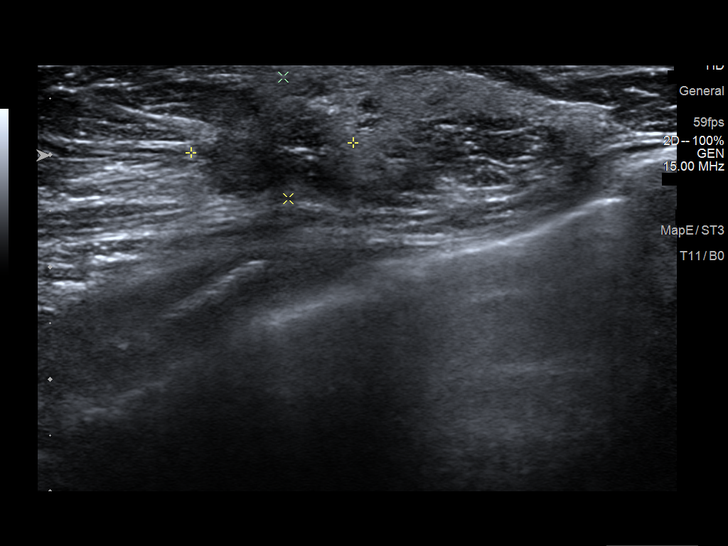
[im 3/10]
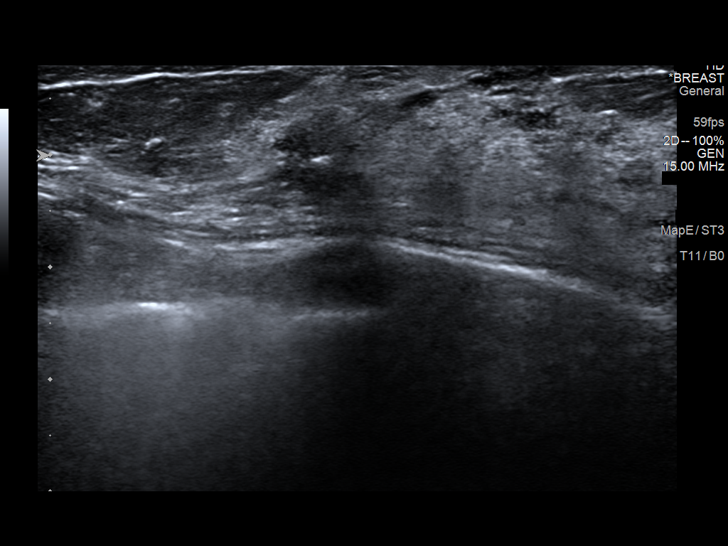
[im 4/10]
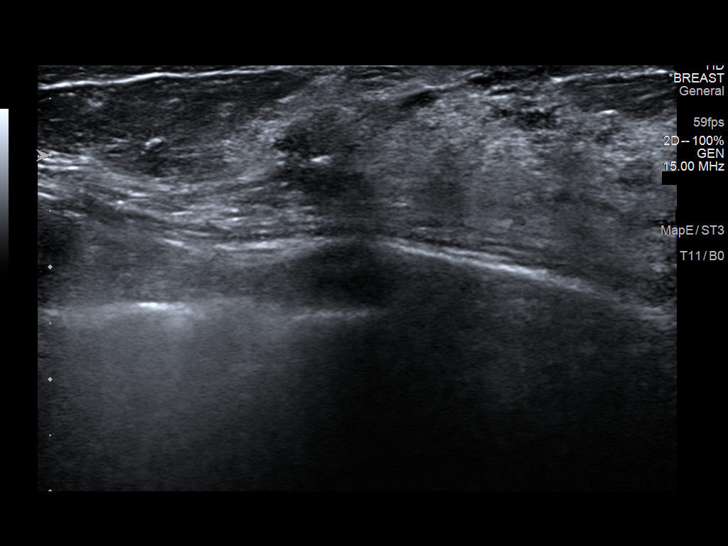
[im 5/10]
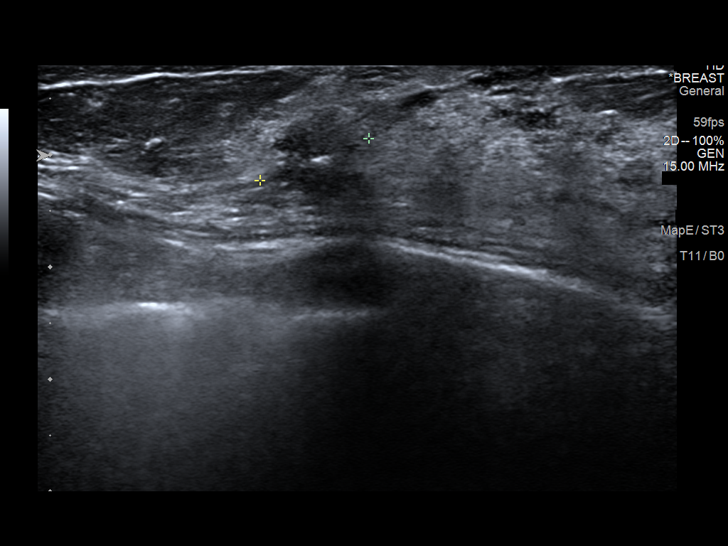
[im 6/10]
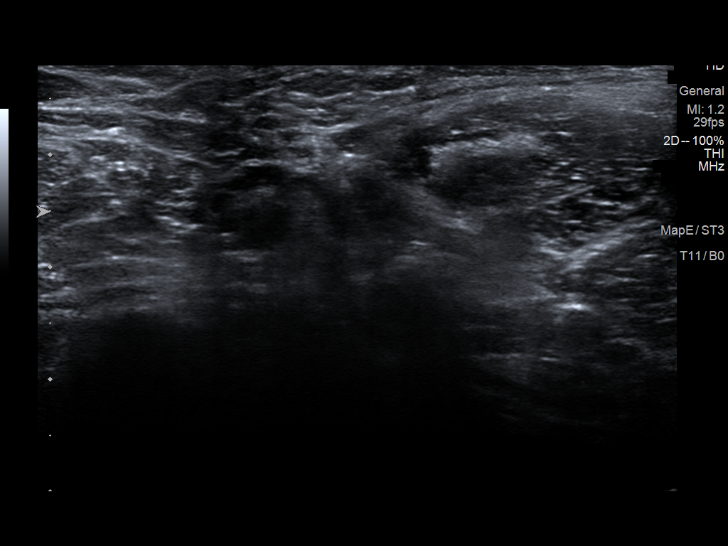
[im 7/10]
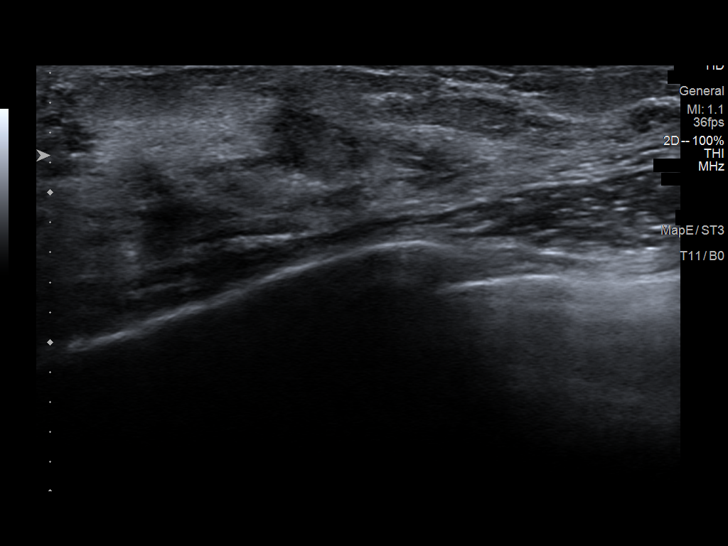
[im 8/10]
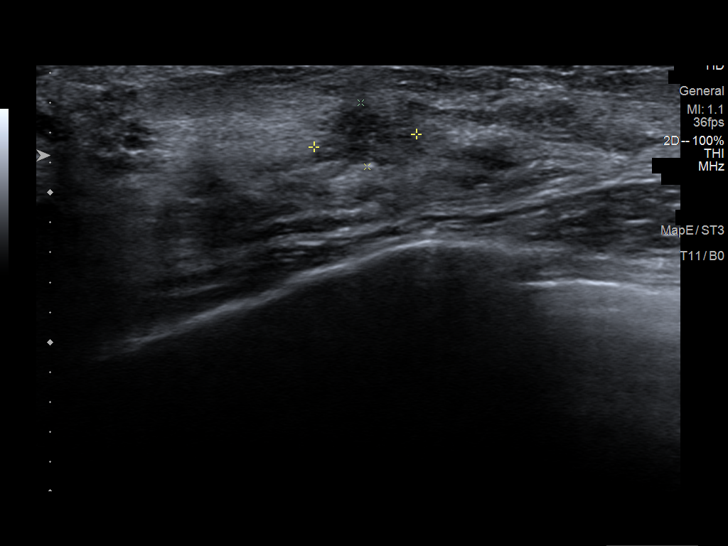
[im 9/10]
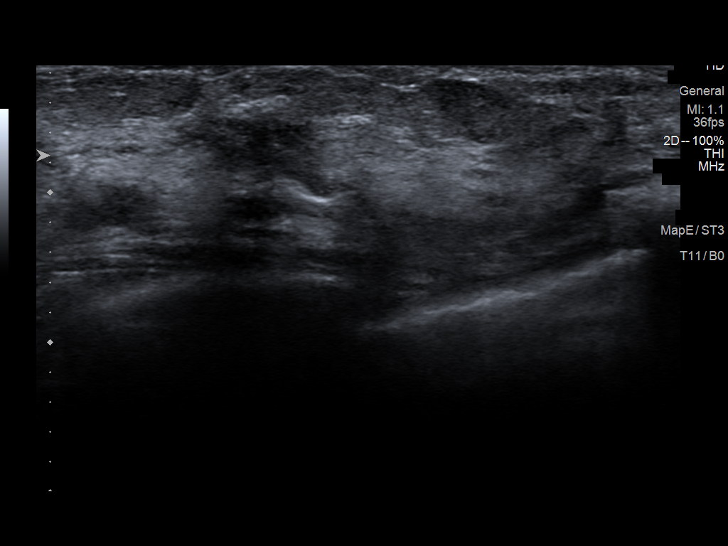
[im 10/10]
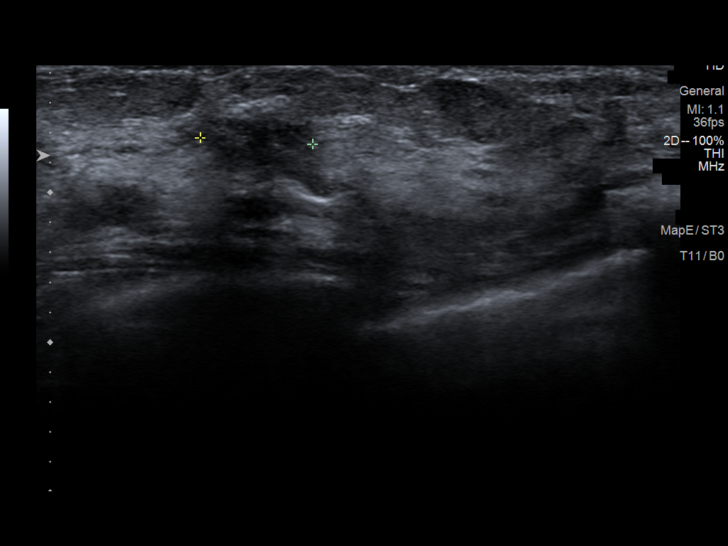

[10 of 10 positions shown; findings below may reference images not displayed]

ACR Breast Density Category c: The breast tissue is heterogeneously
dense, which may obscure small masses.
FINDINGS: Additional imaging of the right and left breast was obtained. There
is extensive distortion identified throughout the breasts
bilaterally both on the full paddle and spot compression views.
Three biopsy marking clips are demonstrated within the left breast
and two biopsy marking clips are demonstrated within the right
breast. There is a fairly large mass associated within the lower
inner left breast at the site of ribbon shaped marking clip.

Targeted ultrasound is performed, showing a 1.5 x 1.1 x 1.0 cm
irregular mass left breast 7 o'clock position 1 cm from the nipple
containing a biopsy marking clip.

Within the left breast 3 o'clock position 2 cm from the nipple there
is a 0.7 x 0.4 x 0.8 cm irregular hypoechoic mass.

No left axillary adenopathy.

Within the right breast 12 o'clock position 1 cm from the nipple
there is a 3.0 x 1.5 x 1.5 cm irregular masslike area of mixed
echogenicity with shadowing.

No right axillary adenopathy.
IMPRESSION: 1. Patient has multiple bilateral complex sclerosing lesions which
have been previously biopsied. There is extensive distortion
demonstrated throughout the breasts bilaterally, making evaluation
for any new findings or changes from prior exam difficult given the
extensive nature of the underlying background parenchyma and
multiple areas of distortion.
2. There is a new mass within the left breast 3 o'clock position 2
cm from the nipple.
3. There is a large suspicious area within the right breast 12
o'clock position 1 cm from the nipple.

RECOMMENDATION:
1. Recommend ultrasound-guided core needle biopsy of the left breast
mass 3 o'clock position 2 cm from the nipple.
2. Retro min ultrasound-guided core needle biopsy of the right
breast 12 o'clock position 1 cm from the nipple.
3. After both of these biopsies, particularly if they both
demonstrate complex sclerosing lesions, patient will need surgical
consultation with Dr. UNIKATNI BOZICNI. I have personally spoken with Dr.
UNIKATNI BOZICNI about this case at the time of diagnostic exam. Additionally,
patient would benefit from bilateral breast MRI. Given that these
complex sclerosing lesions all have sonographic correlates, excision
should be strongly considered. If not all of the lesions are going
to be excised, the most suspicious lesion at this time is the left
breast 7 o'clock position.

I have discussed the findings and recommendations with the patient.
If applicable, a reminder letter will be sent to the patient
regarding the next appointment.

BI-RADS CATEGORY  4: Suspicious.

## 2020-09-19 ENCOUNTER — Other Ambulatory Visit: Payer: Self-pay

## 2020-09-19 ENCOUNTER — Ambulatory Visit
Admission: RE | Admit: 2020-09-19 | Discharge: 2020-09-19 | Disposition: A | Discharge status: Home / Self Care | Payer: BC Managed Care – PPO | Source: Ambulatory Visit | Attending: General Surgery | Admitting: General Surgery

## 2020-09-19 ENCOUNTER — Other Ambulatory Visit: Payer: Self-pay | Admitting: General Surgery

## 2020-09-19 DIAGNOSIS — R928 Other abnormal and inconclusive findings on diagnostic imaging of breast: Secondary | ICD-10-CM

## 2020-09-19 DIAGNOSIS — N6310 Unspecified lump in the right breast, unspecified quadrant: Secondary | ICD-10-CM

## 2020-09-19 IMAGING — US US BREAST BX W LOC DEV 1ST LESION IMG BX SPEC US GUIDE*L*
1 series · 14 of 14 positions shown · non-contrast
Comparison: Previous exam(s).
COMPARISON: Previous exam(s).

Addendum:
CLINICAL DATA: 50-year-old female presenting for bilateral breast
ultrasound-guided biopsies.

EXAM:
ULTRASOUND GUIDED BILATERAL BREAST CORE NEEDLE BIOPSY

[Series 1: us breast bx w loc dev 1st lesion img bx spec us g · 0.06mm/px · 14 of 14 slices shown]
[im 1/14]
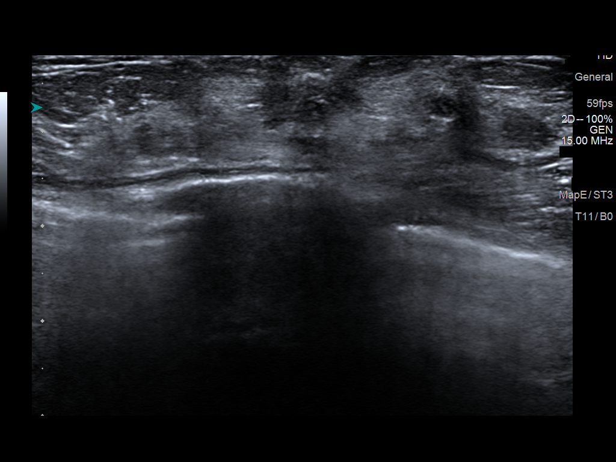
[im 2/14]
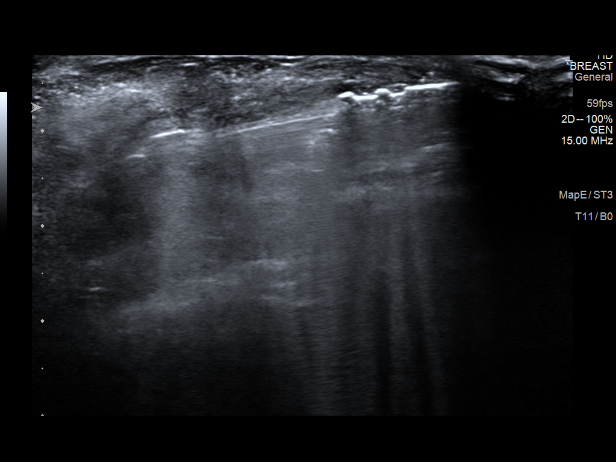
[im 3/14]
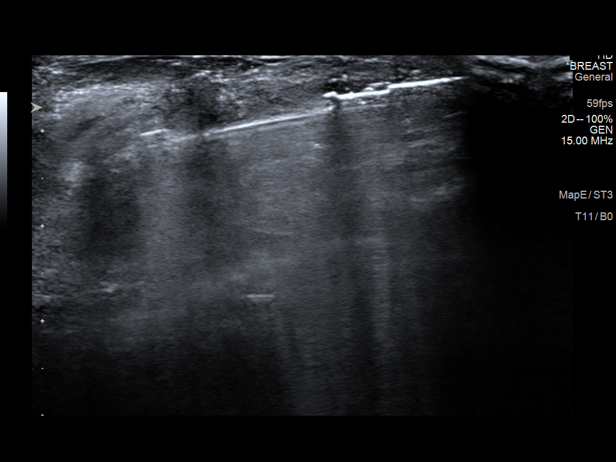
[im 4/14]
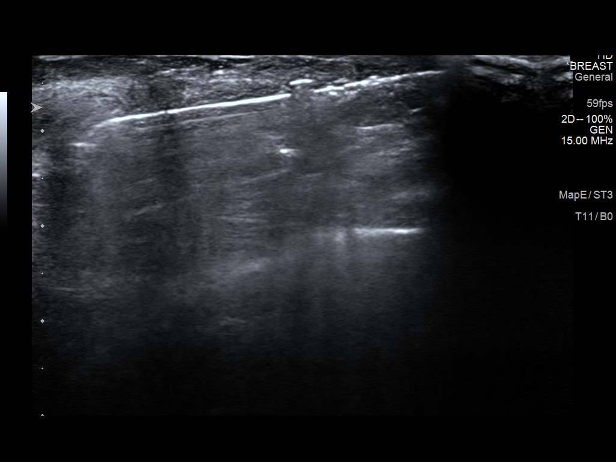
[im 5/14]
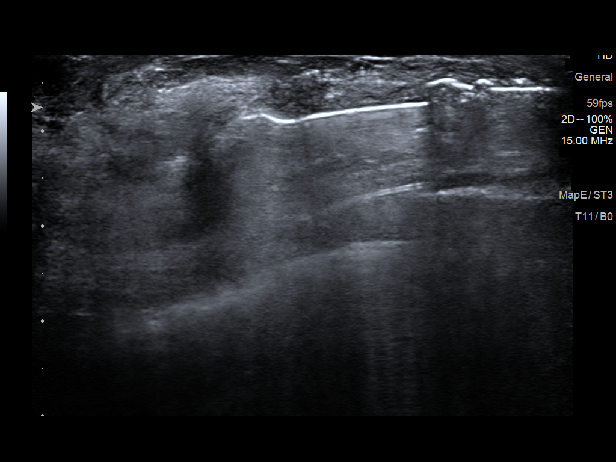
[im 6/14]
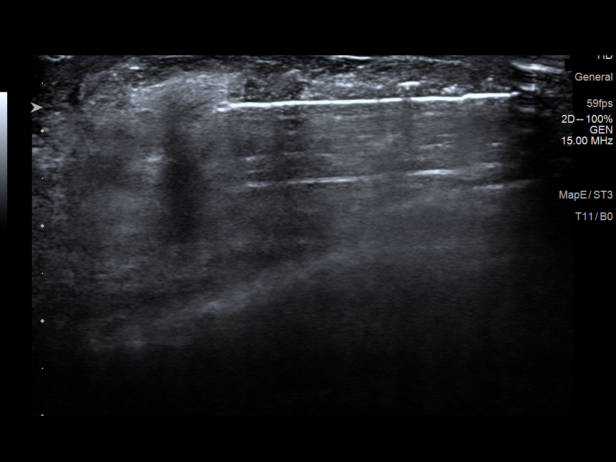
[im 7/14]
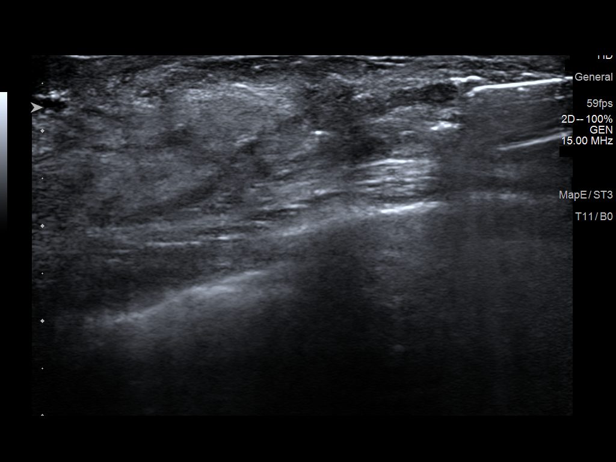
[im 8/14]
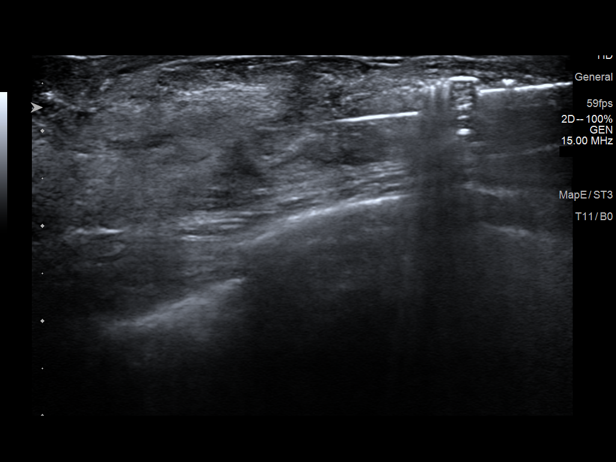
[im 9/14]
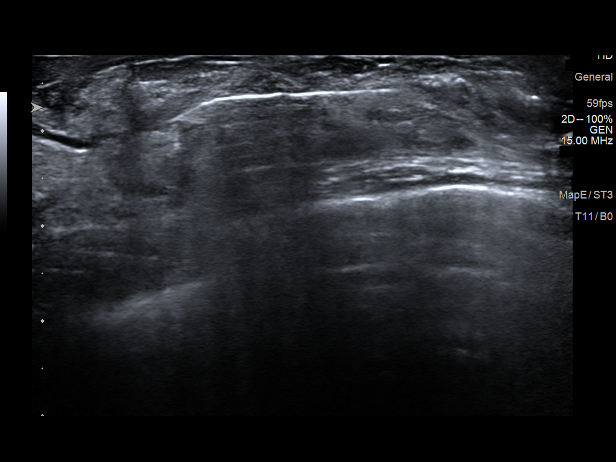
[im 10/14]
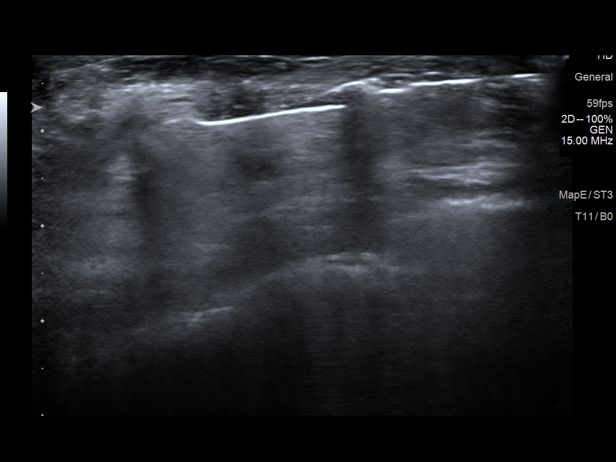
[im 11/14]
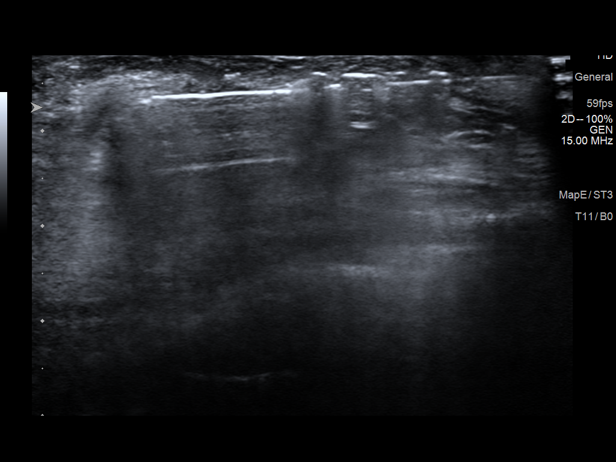
[im 12/14]
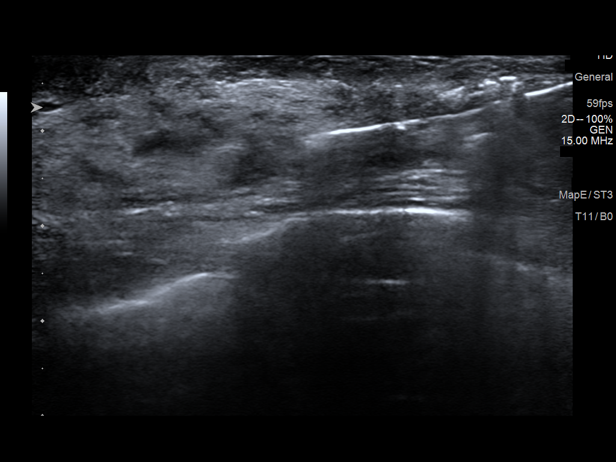
[im 13/14]
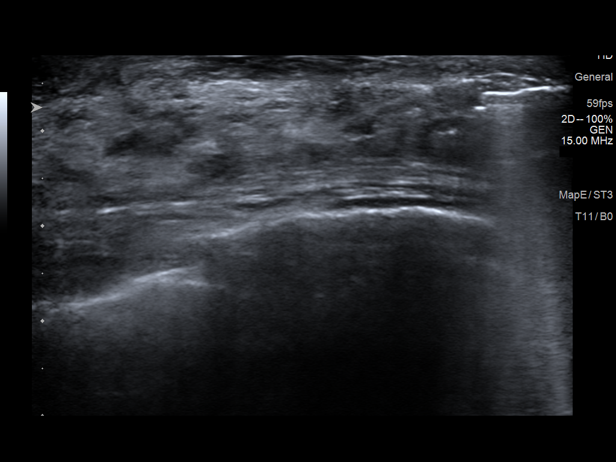
[im 14/14]
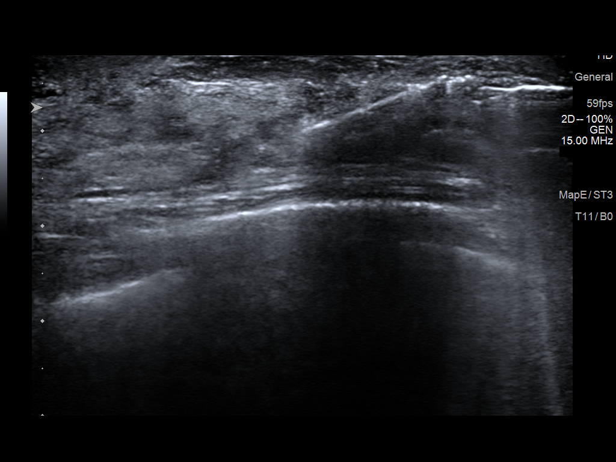

[14 of 14 positions shown; findings below may reference images not displayed]



#1 Lesion quadrant: Upper outer quadrant

Using sterile technique and 1% Lidocaine as local anesthetic, under
direct ultrasound visualization, a 14 gauge BEKY device was
used to perform biopsy of a right breast mass at 12 o'clock, 1 cm
from the nipple using a medial approach. At the conclusion of the
procedure a coil shaped tissue marker clip was deployed into the
biopsy cavity.

--------------------------------------------------------------------------------------------------------------------------------------------

#2 Lesion quadrant: Upper outer quadrant

Using sterile technique and 1% Lidocaine as local anesthetic, under
direct ultrasound visualization, a 14 gauge BEKY device was
used to perform biopsy of a left breast mass at 3 o'clock, 2 cm from
the nipple using a lateral approach. At the conclusion of the
procedure a tribell shaped tissue marker clip was deployed into the
biopsy cavity.

Follow up 2 view mammogram was performed and dictated separately.
IMPRESSION: 1. Ultrasound guided biopsy of a right breast mass at 12 o'clock
(coil clip). No apparent complications.

2. Ultrasound guided biopsy of a left breast mass at 3 o'clock
(tribell clip). No apparent complications.

ADDENDUM:
Pathology revealed COMPLEX SCLEROSING LESION WITH USUAL DUCTAL
HYPERPLASIA of the RIGHT breast, 12 o'clock 1 cmfn (coil clip). This
was found to be concordant by Dr. BEKY, with surgical
consultation for consideration of excision recommended.

Pathology revealed COMPLEX SCLEROSING LESION WITH USUAL DUCTAL
HYPERPLASIA of the LEFT breast, 3 o'clock 2 cmfn (tribell clip).
This was found to be concordant by Dr. BEKY, with
surgical consultation for consideration of excision recommended.

Pathology results were discussed with the patient by telephone. The
patient reported doing well after the biopsies with tenderness and
bruising at the sites, LEFT more than RIGHT. Post biopsy
instructions and care were reviewed and questions were answered. The
patient was encouraged to call [REDACTED] for any additional concerns. My direct phone number was
provided.

Surgical consultation has been arranged with Dr. BEKY at
[REDACTED] on [DATE].

Recommendation for BILATERAL diagnostic mammogram and possible
ultrasound in 6 months if surgical excision is not pursued, as well
as consideration for MRI monitoring with annual MRI.

Pathology results reported by BEKY, RN on [DATE].



#1 Lesion quadrant: Upper outer quadrant

Using sterile technique and 1% Lidocaine as local anesthetic, under
direct ultrasound visualization, a 14 gauge BEKY device was
used to perform biopsy of a right breast mass at 12 o'clock, 1 cm
from the nipple using a medial approach. At the conclusion of the
procedure a coil shaped tissue marker clip was deployed into the
biopsy cavity.

--------------------------------------------------------------------------------------------------------------------------------------------

#2 Lesion quadrant: Upper outer quadrant

Using sterile technique and 1% Lidocaine as local anesthetic, under
direct ultrasound visualization, a 14 gauge BEKY device was
used to perform biopsy of a left breast mass at 3 o'clock, 2 cm from
the nipple using a lateral approach. At the conclusion of the
procedure a tribell shaped tissue marker clip was deployed into the
biopsy cavity.

Follow up 2 view mammogram was performed and dictated separately.
IMPRESSION: 1. Ultrasound guided biopsy of a right breast mass at 12 o'clock
(coil clip). No apparent complications.

2. Ultrasound guided biopsy of a left breast mass at 3 o'clock
(tribell clip). No apparent complications.

## 2020-09-19 IMAGING — US US  BREAST BX W/ LOC DEV 1ST LESION IMG BX SPEC US GUIDE*R*
1 series · 13 of 13 positions shown · non-contrast
Comparison: Previous exam(s).
COMPARISON: Previous exam(s).

Addendum:
CLINICAL DATA: 50-year-old female presenting for bilateral breast
ultrasound-guided biopsies.

EXAM:
ULTRASOUND GUIDED BILATERAL BREAST CORE NEEDLE BIOPSY

[Series 1: us breast bx w/ loc dev 1st lesion img bx spec us  · 0.06mm/px · 13 of 13 slices shown]
[im 1/13]
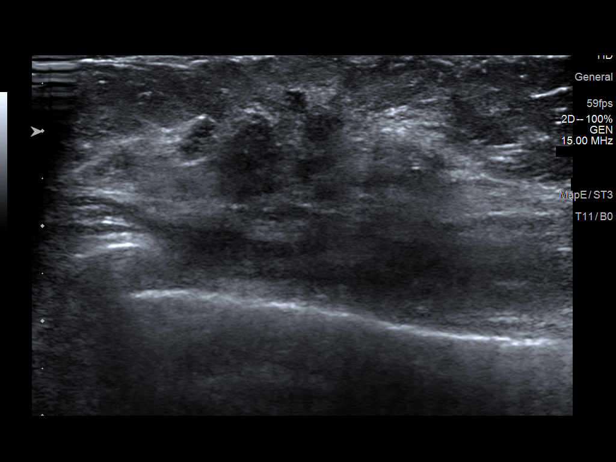
[im 2/13]
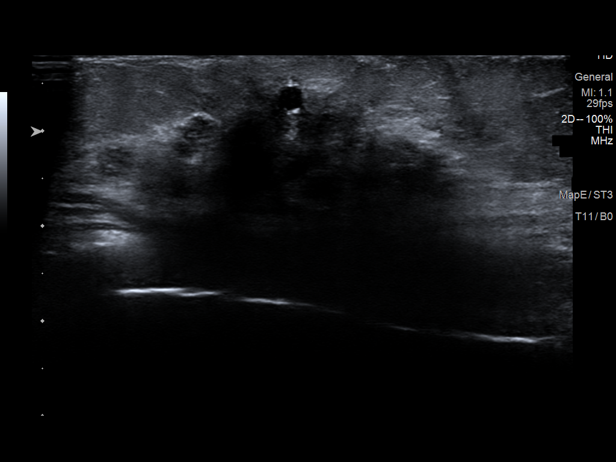
[im 3/13]
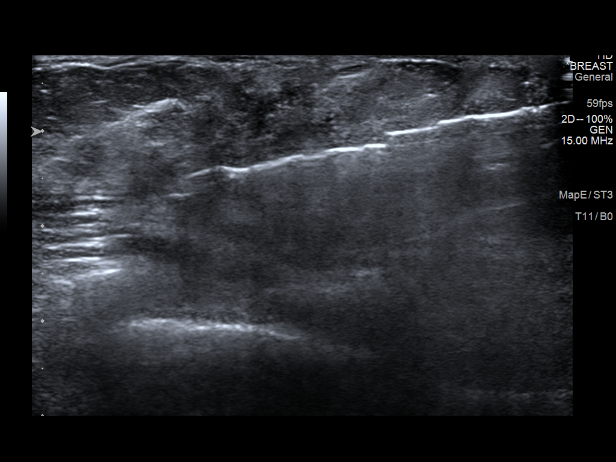
[im 4/13]
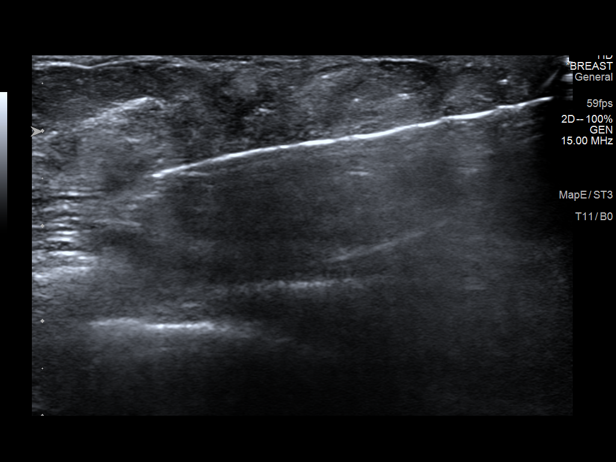
[im 5/13]
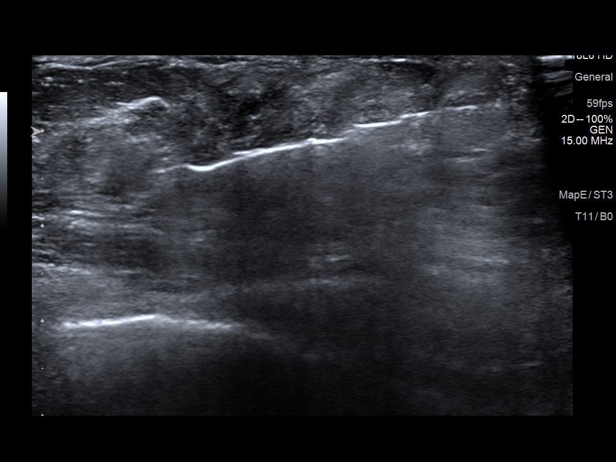
[im 6/13]
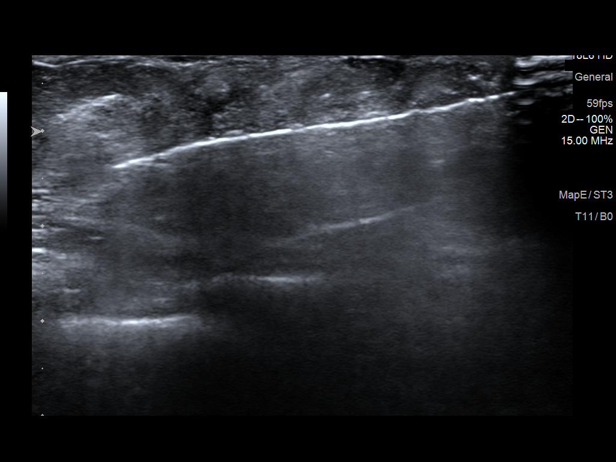
[im 7/13]
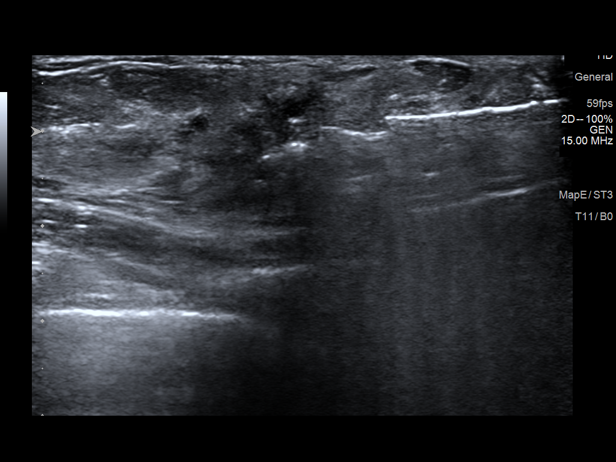
[im 8/13]
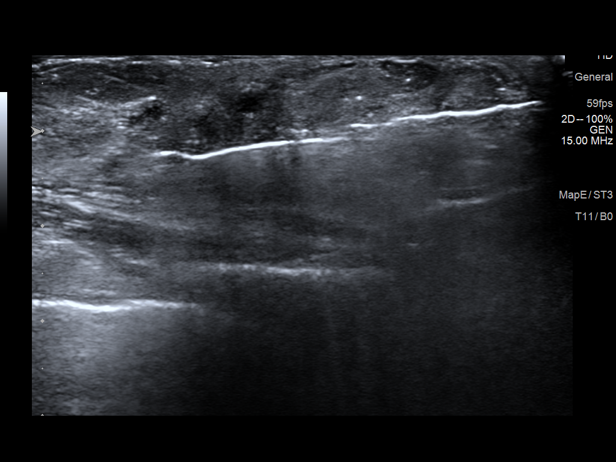
[im 9/13]
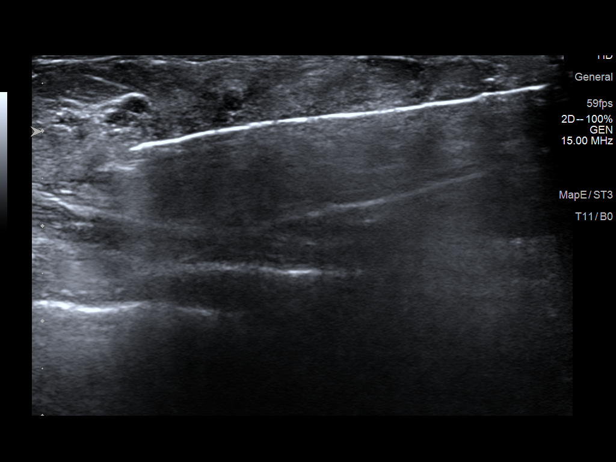
[im 10/13]
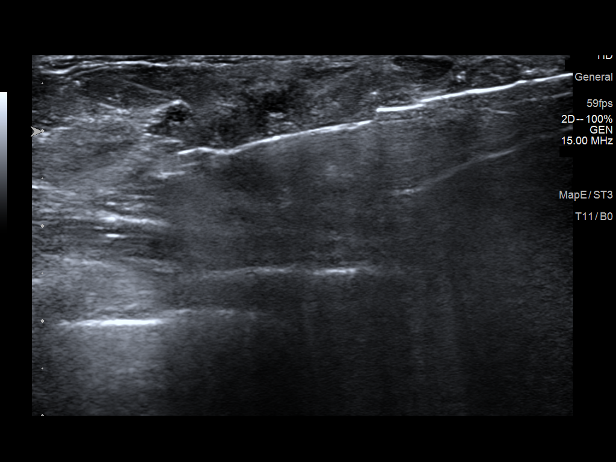
[im 11/13]
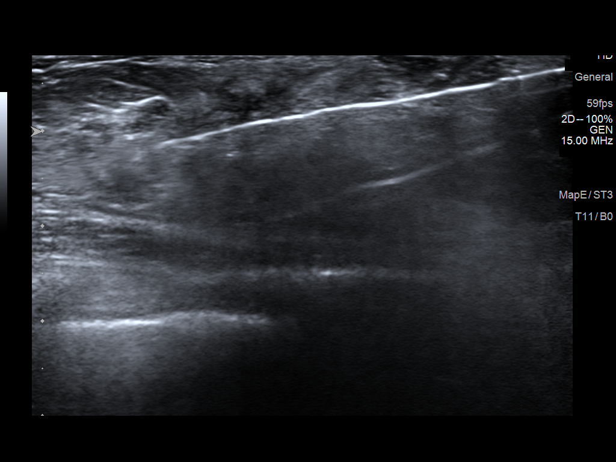
[im 12/13]
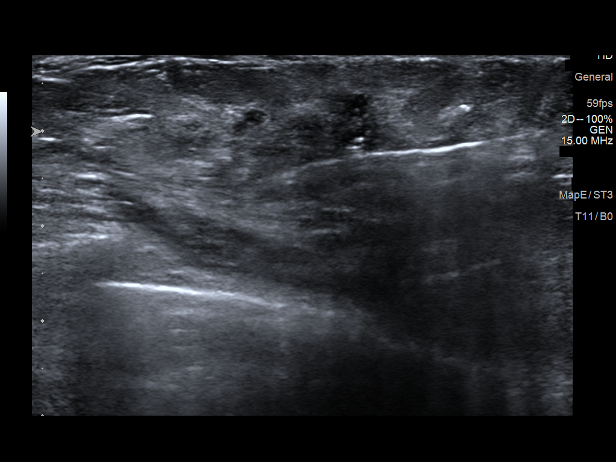
[im 13/13]
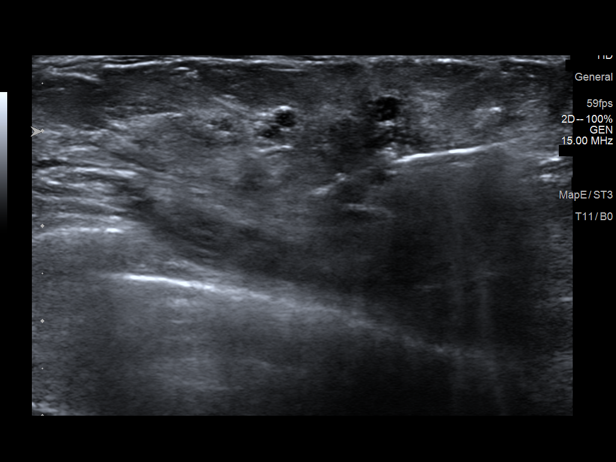

[13 of 13 positions shown; findings below may reference images not displayed]



#1 Lesion quadrant: Upper outer quadrant

Using sterile technique and 1% Lidocaine as local anesthetic, under
direct ultrasound visualization, a 14 gauge BEKY device was
used to perform biopsy of a right breast mass at 12 o'clock, 1 cm
from the nipple using a medial approach. At the conclusion of the
procedure a coil shaped tissue marker clip was deployed into the
biopsy cavity.

--------------------------------------------------------------------------------------------------------------------------------------------

#2 Lesion quadrant: Upper outer quadrant

Using sterile technique and 1% Lidocaine as local anesthetic, under
direct ultrasound visualization, a 14 gauge BEKY device was
used to perform biopsy of a left breast mass at 3 o'clock, 2 cm from
the nipple using a lateral approach. At the conclusion of the
procedure a tribell shaped tissue marker clip was deployed into the
biopsy cavity.

Follow up 2 view mammogram was performed and dictated separately.
IMPRESSION: 1. Ultrasound guided biopsy of a right breast mass at 12 o'clock
(coil clip). No apparent complications.

2. Ultrasound guided biopsy of a left breast mass at 3 o'clock
(tribell clip). No apparent complications.

ADDENDUM:
Pathology revealed COMPLEX SCLEROSING LESION WITH USUAL DUCTAL
HYPERPLASIA of the RIGHT breast, 12 o'clock 1 cmfn (coil clip). This
was found to be concordant by Dr. BEKY, with surgical
consultation for consideration of excision recommended.

Pathology revealed COMPLEX SCLEROSING LESION WITH USUAL DUCTAL
HYPERPLASIA of the LEFT breast, 3 o'clock 2 cmfn (tribell clip).
This was found to be concordant by Dr. BEKY, with
surgical consultation for consideration of excision recommended.

Pathology results were discussed with the patient by telephone. The
patient reported doing well after the biopsies with tenderness and
bruising at the sites, LEFT more than RIGHT. Post biopsy
instructions and care were reviewed and questions were answered. The
patient was encouraged to call [REDACTED] for any additional concerns. My direct phone number was
provided.

Surgical consultation has been arranged with Dr. BEKY at
[REDACTED] on [DATE].

Recommendation for BILATERAL diagnostic mammogram and possible
ultrasound in 6 months if surgical excision is not pursued, as well
as consideration for MRI monitoring with annual MRI.

Pathology results reported by BEKY, RN on [DATE].



#1 Lesion quadrant: Upper outer quadrant

Using sterile technique and 1% Lidocaine as local anesthetic, under
direct ultrasound visualization, a 14 gauge BEKY device was
used to perform biopsy of a right breast mass at 12 o'clock, 1 cm
from the nipple using a medial approach. At the conclusion of the
procedure a coil shaped tissue marker clip was deployed into the
biopsy cavity.

--------------------------------------------------------------------------------------------------------------------------------------------

#2 Lesion quadrant: Upper outer quadrant

Using sterile technique and 1% Lidocaine as local anesthetic, under
direct ultrasound visualization, a 14 gauge BEKY device was
used to perform biopsy of a left breast mass at 3 o'clock, 2 cm from
the nipple using a lateral approach. At the conclusion of the
procedure a tribell shaped tissue marker clip was deployed into the
biopsy cavity.

Follow up 2 view mammogram was performed and dictated separately.
IMPRESSION: 1. Ultrasound guided biopsy of a right breast mass at 12 o'clock
(coil clip). No apparent complications.

2. Ultrasound guided biopsy of a left breast mass at 3 o'clock
(tribell clip). No apparent complications.

## 2020-09-19 IMAGING — MG MM BREAST LOCALIZATION CLIP
6 series · 6 of 18 positions shown · non-contrast
Comparison: Previous exam(s).

CLINICAL DATA: Post biopsy mammogram of the bilateral breasts for
clip placement.

EXAM:
3D DIAGNOSTIC BILATERAL MAMMOGRAM POST ULTRASOUND BIOPSY

[R CC synth-2D (1 of 2)]
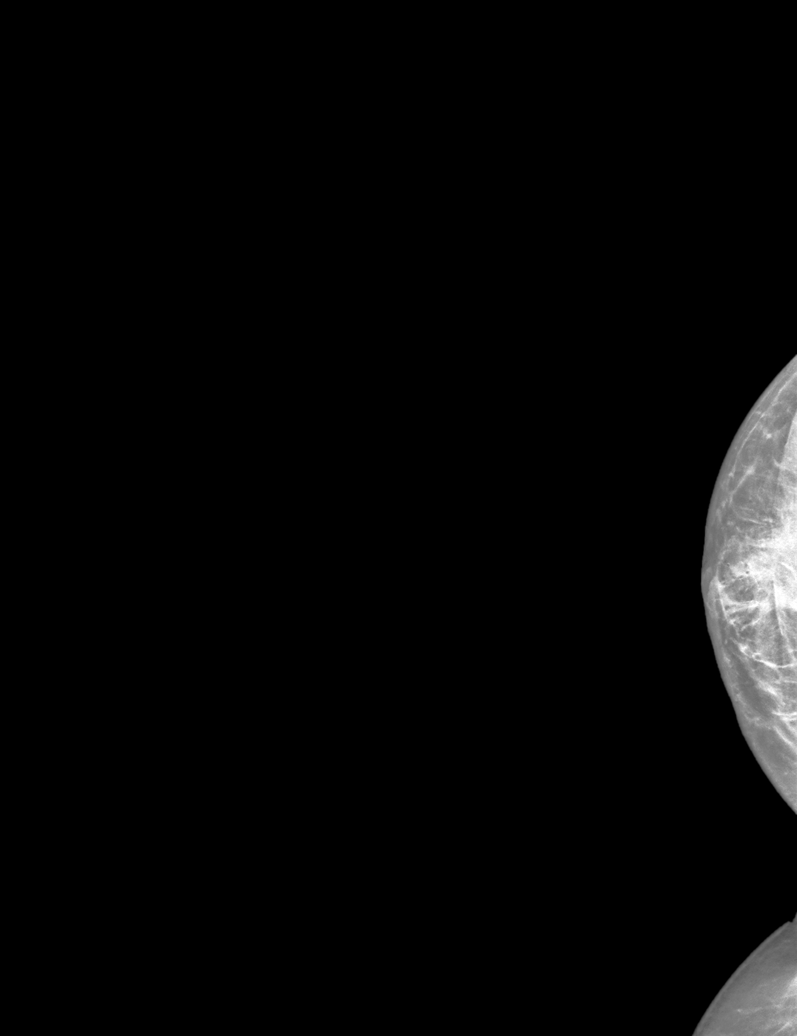

[R CC synth-2D (2 of 2)]
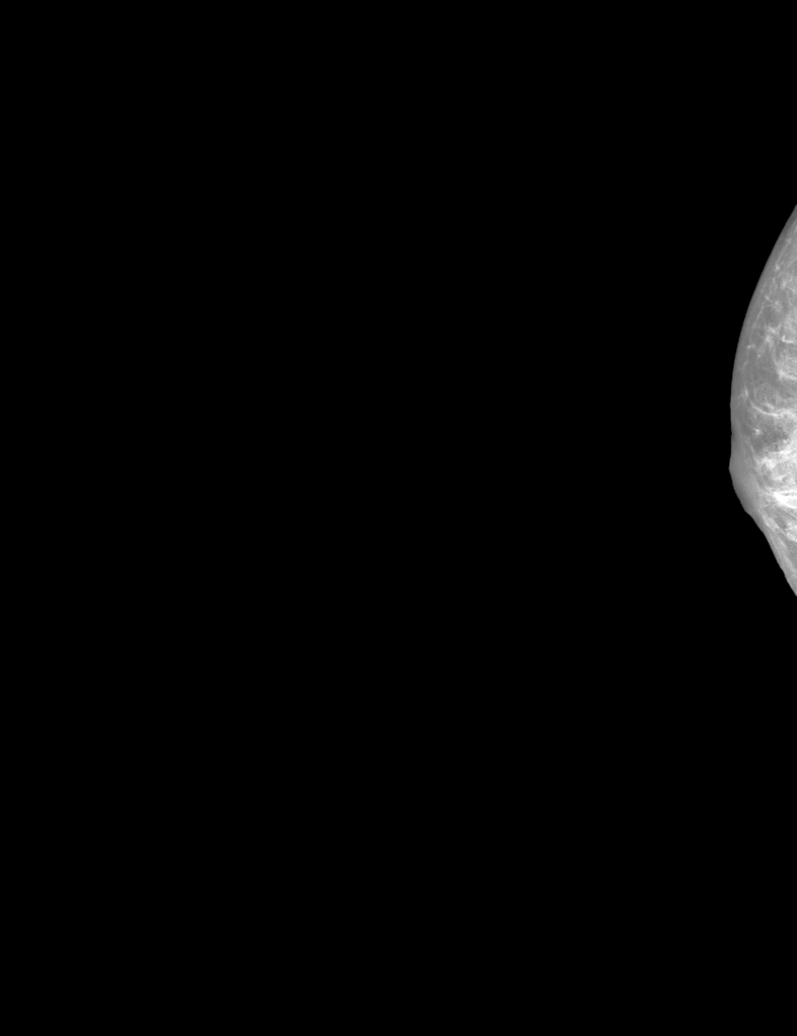

[R ML synth-2D]
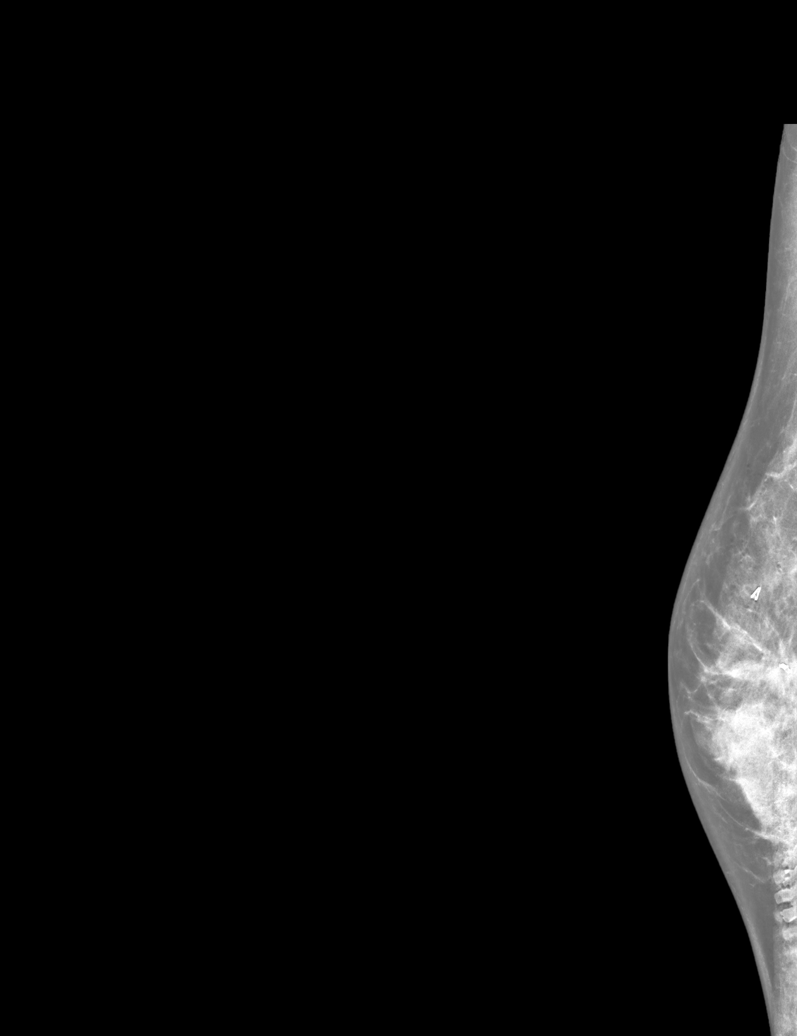

[R ML tomo · tomo slice 27/54.0]
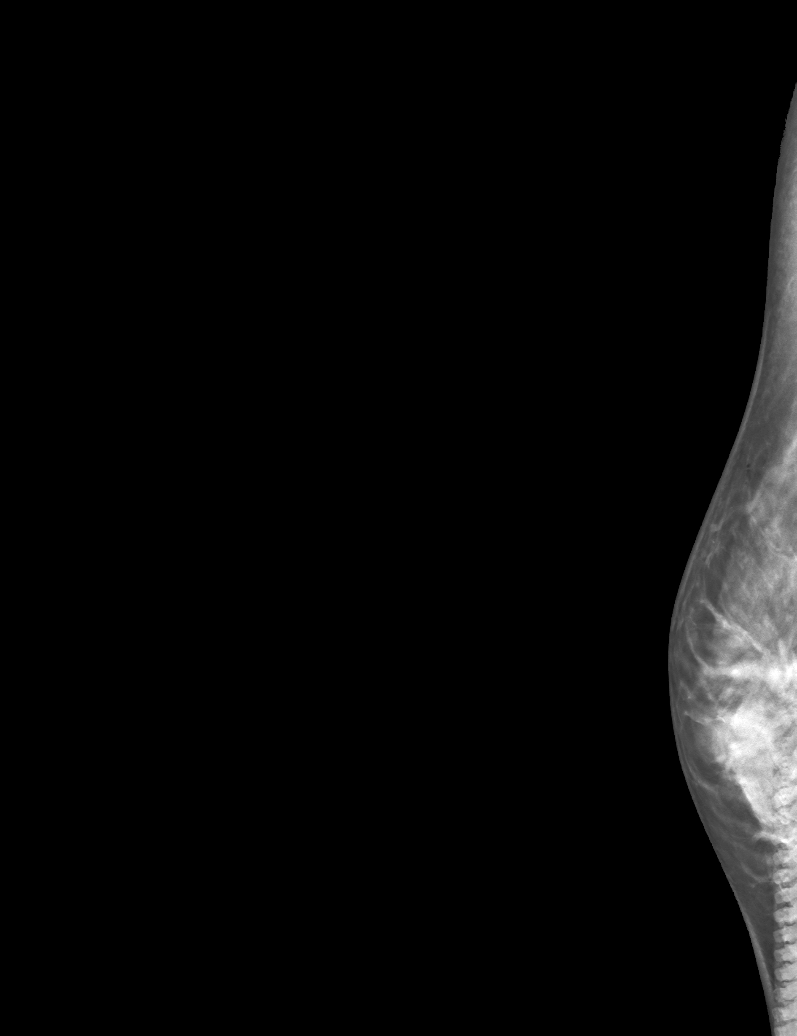

[R CC tomo (1 of 2) · tomo slice 26/51.0]
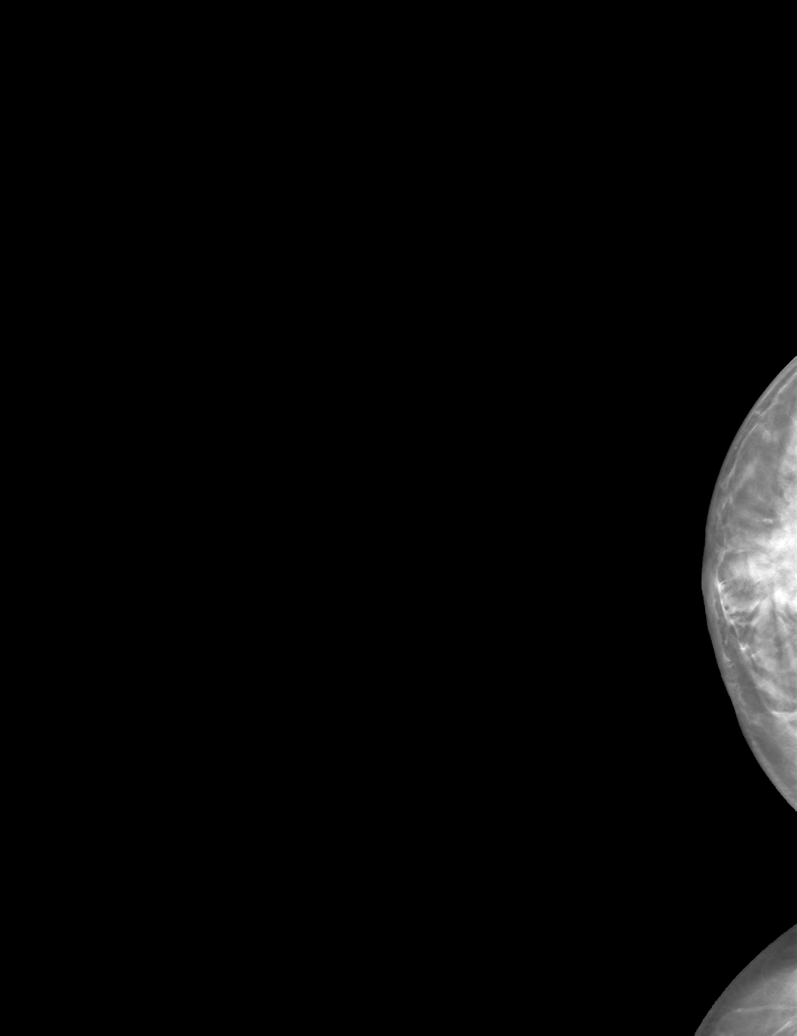

[R CC tomo (2 of 2) · tomo slice 29/57.0]
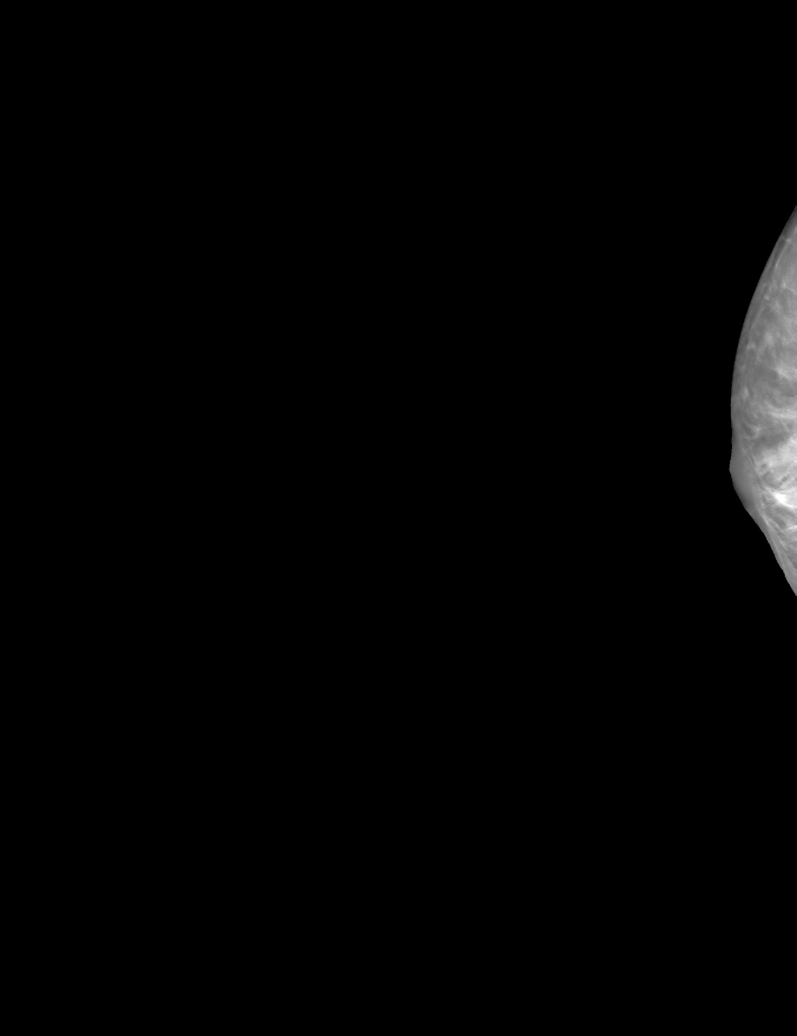

[6 of 18 positions shown; findings below may reference images not displayed]

FINDINGS: 3D Mammographic images were obtained following ultrasound guided
biopsy of bilateral breast masses. The biopsy marking clip is are in
the expected position at the sites of biopsy in the bilateral
breasts.

The patient has a heart and ribbon shaped biopsy marking clip from
prior biopsied complex sclerosing lesions in the right breast. The
patient has heart, ribbon and coil shaped biopsy marking clips from
prior biopsied complex sclerosing lesions in the left breast.
IMPRESSION: 1. Appropriate positioning of the coil shaped biopsy marking clip at
the site of biopsy in the superior right breast.

2. Appropriate positioning of the tribell shaped biopsy marking clip
at the site of biopsy in the lateral left breast.

Final Assessment: Post Procedure Mammograms for Marker Placement

## 2020-09-19 IMAGING — MG MM BREAST LOCALIZATION CLIP
4 series · 4 of 12 positions shown · non-contrast
Comparison: Previous exam(s).

CLINICAL DATA: Post biopsy mammogram of the bilateral breasts for
clip placement.

EXAM:
3D DIAGNOSTIC BILATERAL MAMMOGRAM POST ULTRASOUND BIOPSY

[L ML synth-2D]
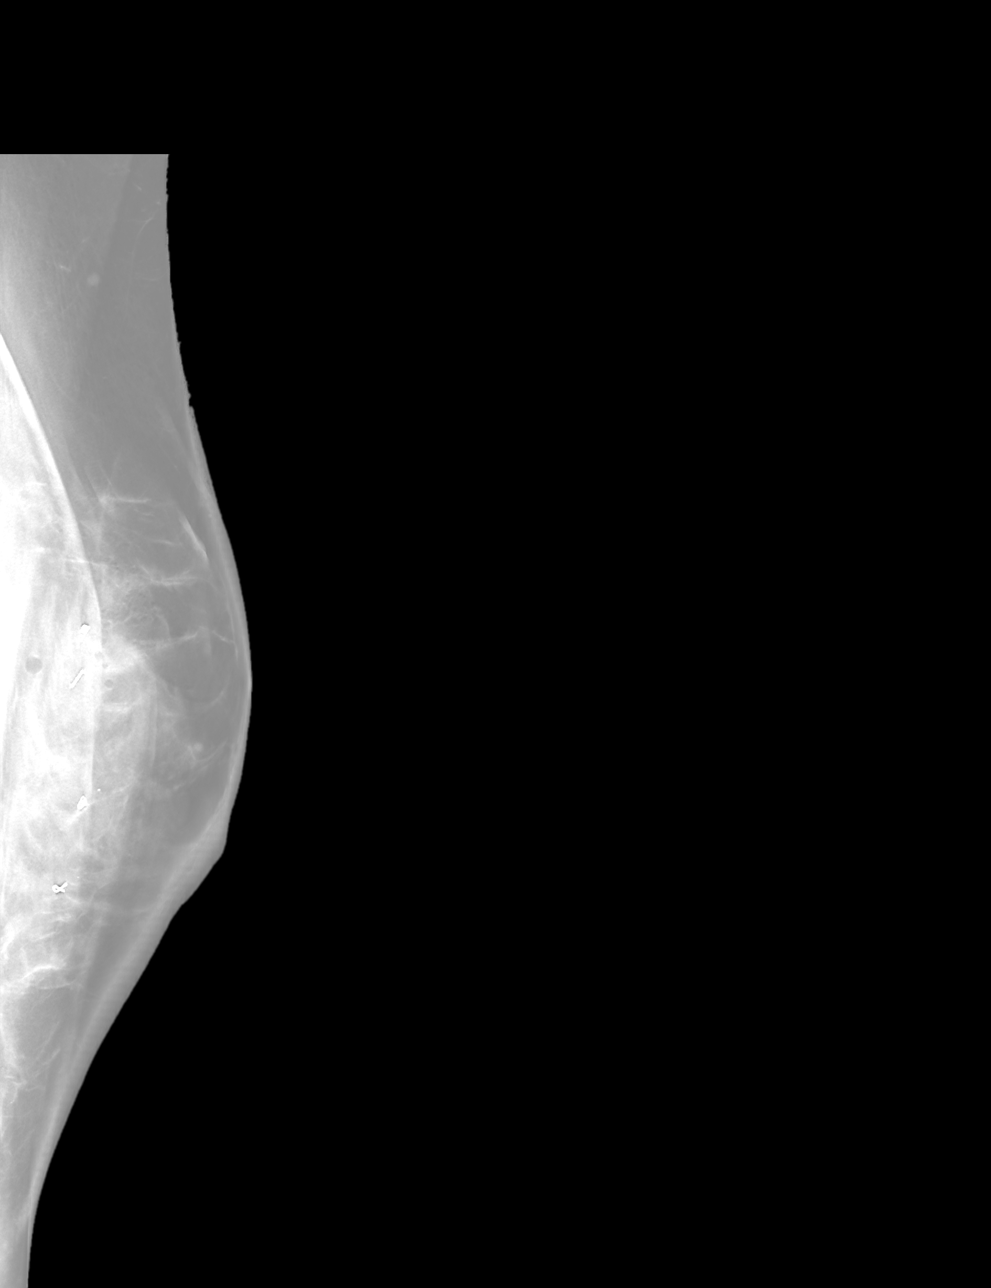

[L CC synth-2D]
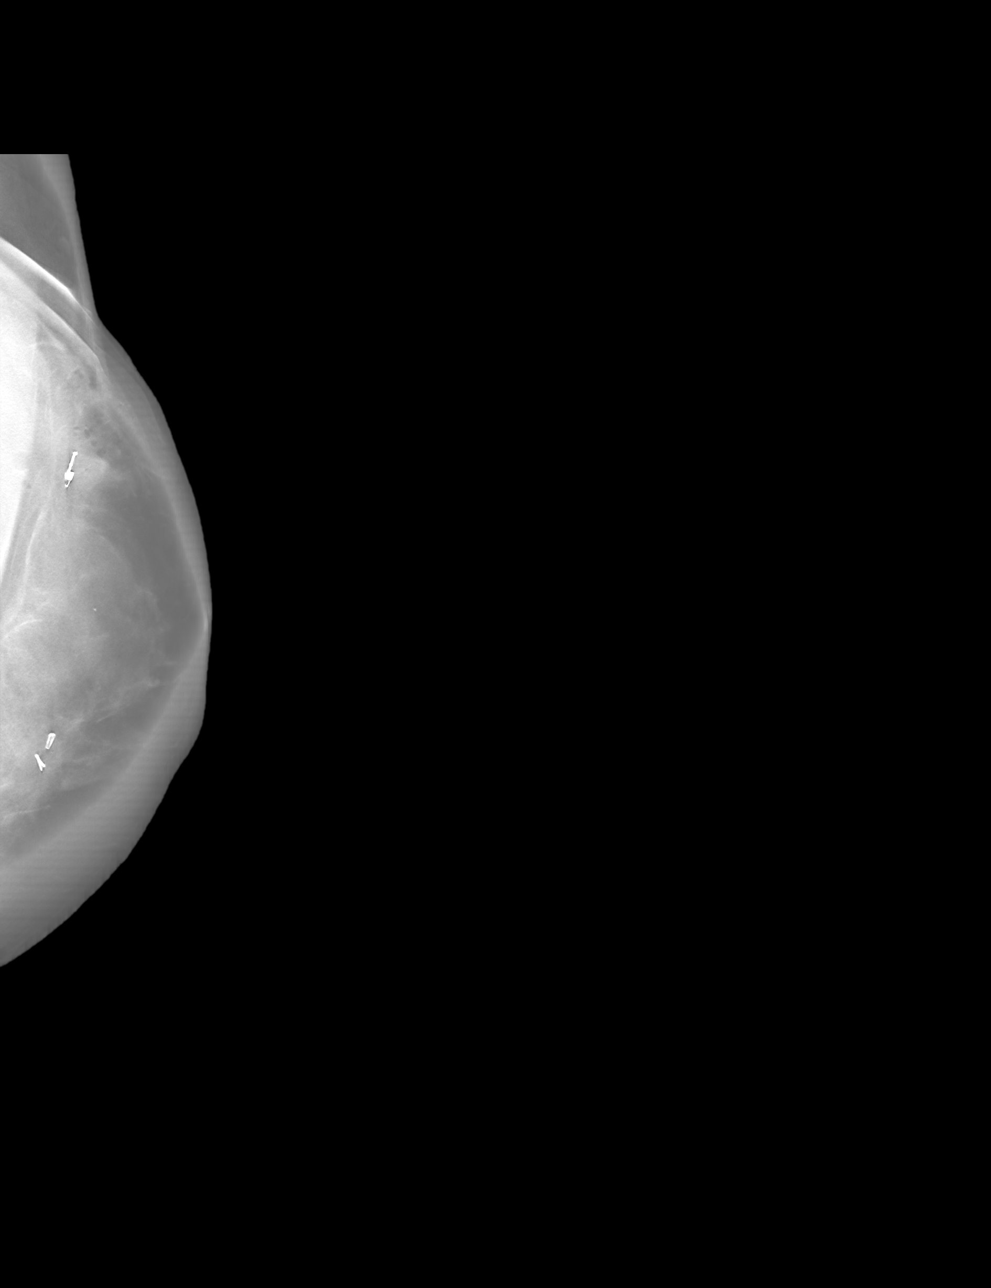

[L CC tomo · tomo slice 57/112.0]
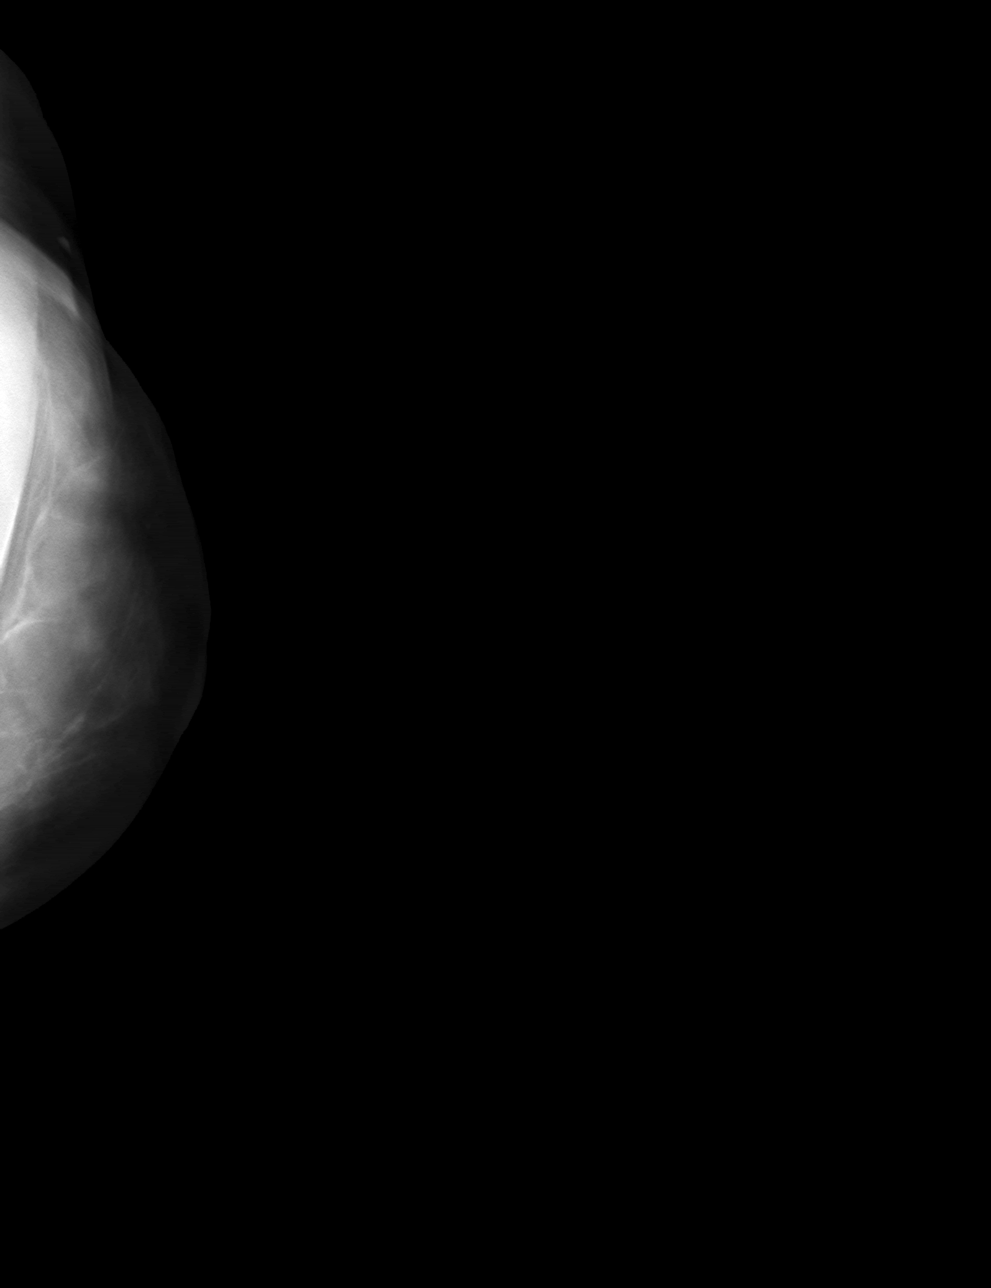

[L ML tomo · tomo slice 45/88.0]
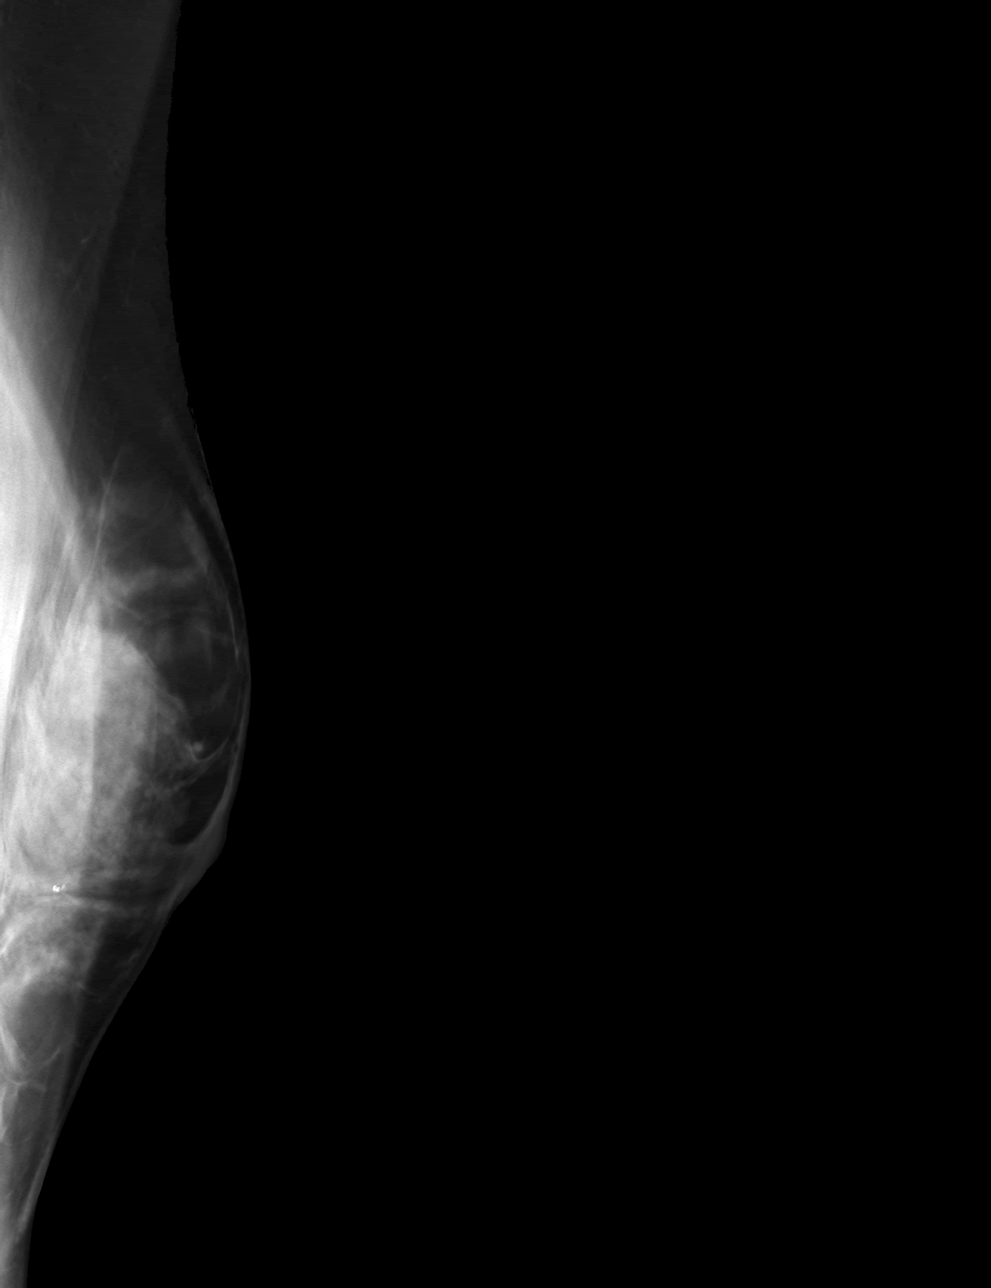

[4 of 12 positions shown; findings below may reference images not displayed]

FINDINGS: 3D Mammographic images were obtained following ultrasound guided
biopsy of bilateral breast masses. The biopsy marking clip is are in
the expected position at the sites of biopsy in the bilateral
breasts.

The patient has a heart and ribbon shaped biopsy marking clip from
prior biopsied complex sclerosing lesions in the right breast. The
patient has heart, ribbon and coil shaped biopsy marking clips from
prior biopsied complex sclerosing lesions in the left breast.
IMPRESSION: 1. Appropriate positioning of the coil shaped biopsy marking clip at
the site of biopsy in the superior right breast.

2. Appropriate positioning of the tribell shaped biopsy marking clip
at the site of biopsy in the lateral left breast.

Final Assessment: Post Procedure Mammograms for Marker Placement

## 2020-09-22 ENCOUNTER — Other Ambulatory Visit: Payer: BC Managed Care – PPO

## 2020-10-15 ENCOUNTER — Other Ambulatory Visit: Payer: Self-pay | Admitting: General Surgery

## 2020-10-15 DIAGNOSIS — Z1239 Encounter for other screening for malignant neoplasm of breast: Secondary | ICD-10-CM

## 2020-10-20 ENCOUNTER — Ambulatory Visit
Admission: RE | Admit: 2020-10-20 | Discharge: 2020-10-20 | Disposition: A | Discharge status: Home / Self Care | Payer: BC Managed Care – PPO | Source: Ambulatory Visit | Attending: General Surgery | Admitting: General Surgery

## 2020-10-20 ENCOUNTER — Other Ambulatory Visit: Payer: Self-pay

## 2020-10-20 DIAGNOSIS — Z1239 Encounter for other screening for malignant neoplasm of breast: Secondary | ICD-10-CM

## 2020-10-20 IMAGING — MR MR BREAST BILAT WO/W CM
8 of 12 series · 33 of 48 positions shown · IV contrast (6 ML GADAVIST)
Comparison: [DATE] and earlier; MRI [DATE]

CLINICAL DATA: Patient's mother was diagnosed with breast cancer at
age 67. Patient has had multiple ultrasound-guided core biopsies
showing complex sclerosing lesions in the RIGHT breast 10:30 o'clock
location, 5 o'clock location, 10 o'clock location, and 12 o'clock
location, LEFT breast 7 o'clock, 1 o'clock, and 10 o'clock
locations. No biopsies have shown atypia or malignancy. Follow-up
MRI was recommended.

LABS:  None obtained at the time of imaging.
EXAM:
BILATERAL BREAST MRI WITH AND WITHOUT CONTRAST
TECHNIQUE: Multiplanar, multisequence MR images of both breasts were obtained
prior to and following the intravenous administration of 6 ml of
Gadavist

[Series 2: t2_tirm_tra ipat (a-p) · axial · 3.0mm · 0.66mm/px · 1 of 55 slices shown]
[im 1/55]
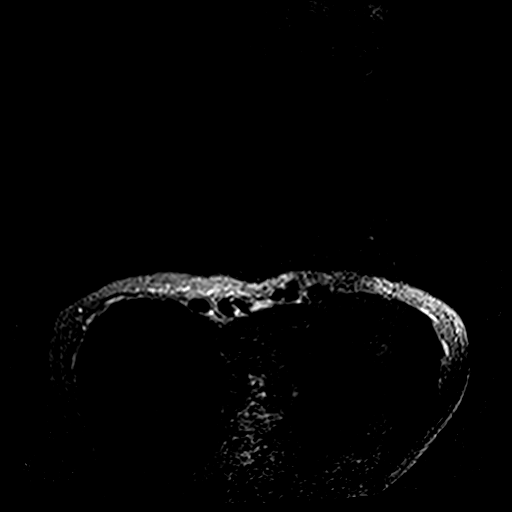

[Series 3: fl3d pre-cm no · axial · non-contrast · 1.2mm · 0.86mm/px · z∈[-109,+62]mm · 5 of 144 slices shown]
[im 1/144]
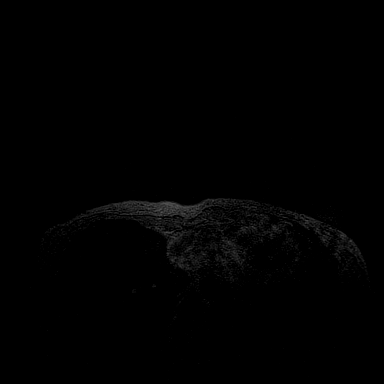
[im 36/144]
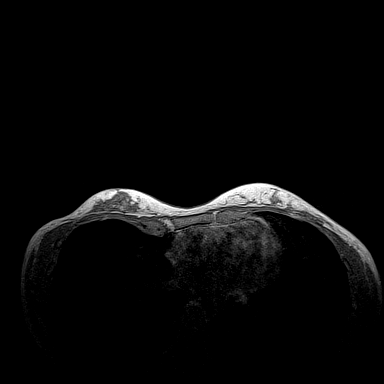
[im 72/144]
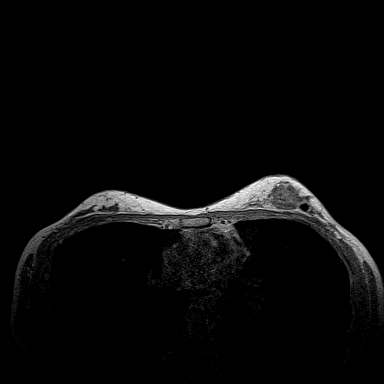
[im 108/144]
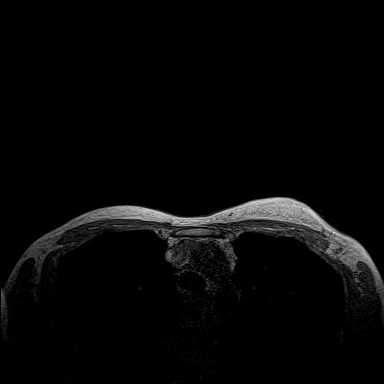
[im 144/144]
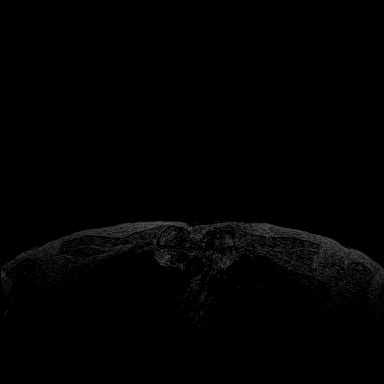

[Series 4: fl3d pre-cm · axial · non-contrast · 1.2mm · 0.86mm/px · z∈[-109,+62]mm · 5 of 144 slices shown]
[im 1/144]
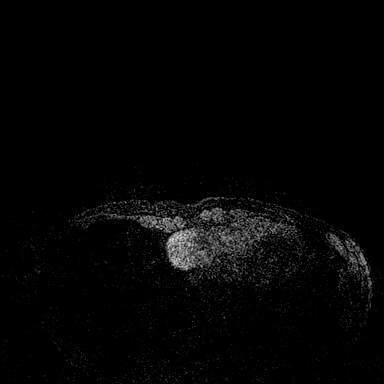
[im 36/144]
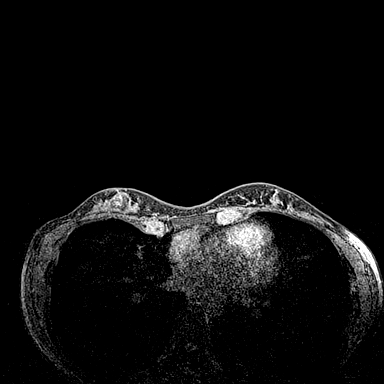
[im 72/144]
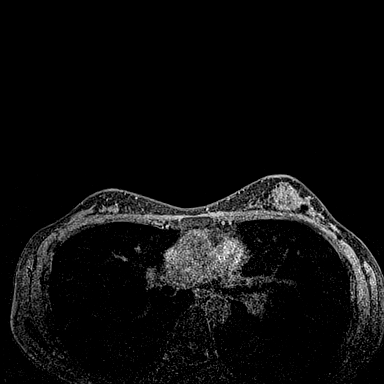
[im 108/144]
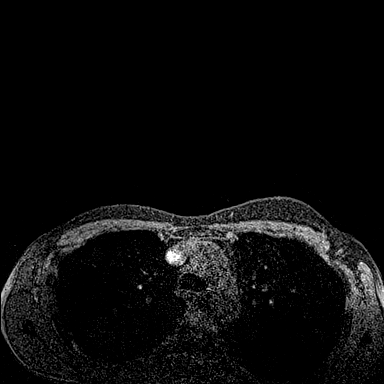
[im 144/144]
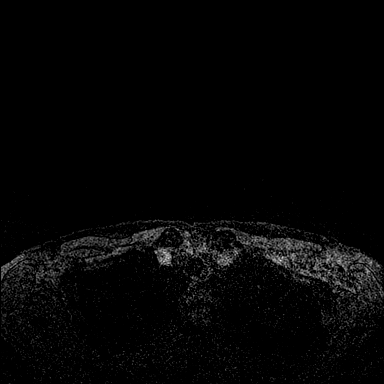

[Series 5: fl3d post-cm 20 · axial · 1.2mm · 0.86mm/px · z∈[-109,+62]mm · 5 of 144 slices shown (1 of 3)]
[im 1/144]
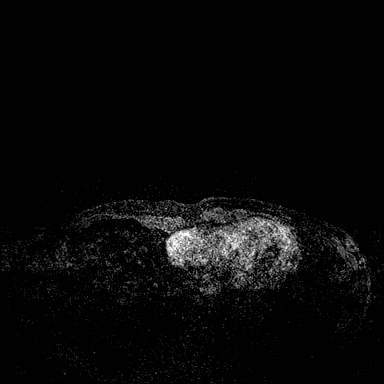
[im 36/144]
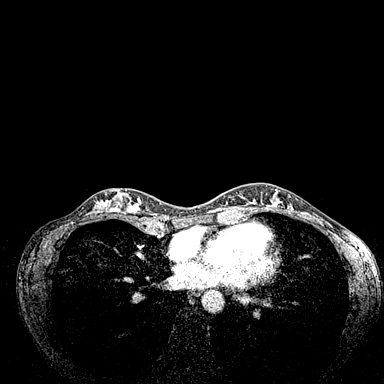
[im 72/144]
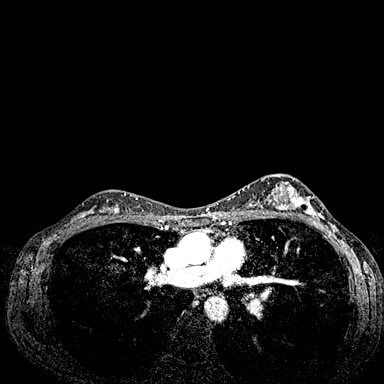
[im 108/144]
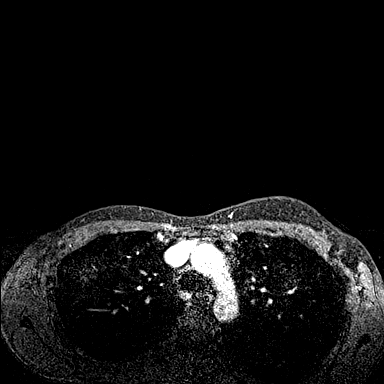
[im 144/144]
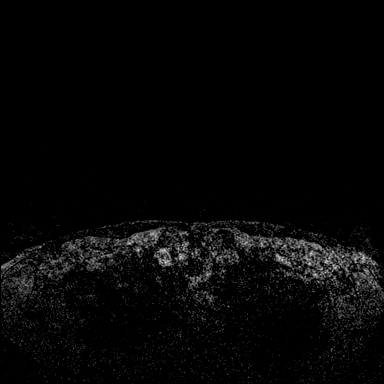

[Series 6: fl3d post-cm 20 · axial · 1.2mm · 0.86mm/px · z∈[-109,+62]mm · 5 of 144 slices shown (2 of 3)]
[im 1/144]
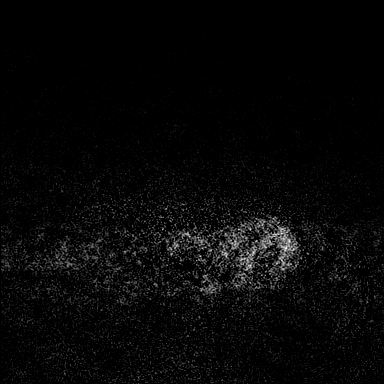
[im 36/144]
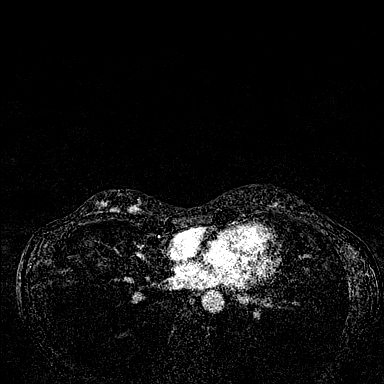
[im 72/144]
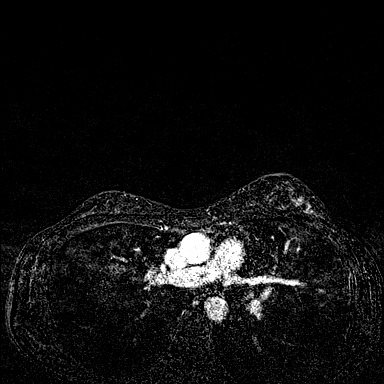
[im 108/144]
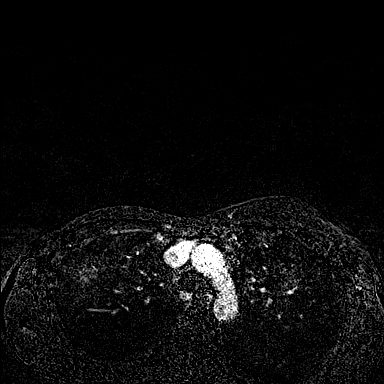
[im 144/144]
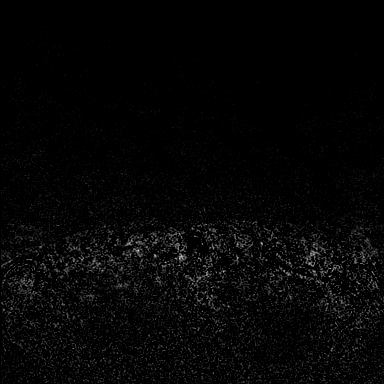

[Series 7: fl3d post-cm 20 · axial · 172.8mm · 0.86mm/px · 1 of 1 slices shown (3 of 3)]
[im 1/1]
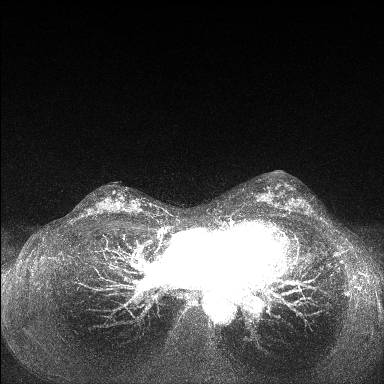

[Series 8: fl3d post-cm 3 · axial · 1.2mm · 0.86mm/px · z∈[-109,+62]mm · 6 of 144 slices shown (1 of 2)]
[im 1/144]
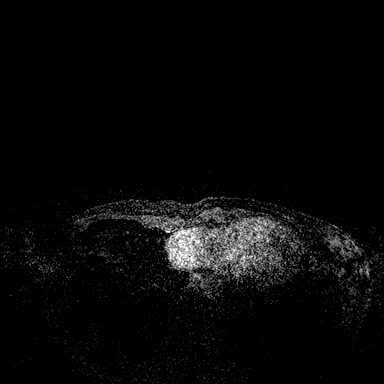
[im 29/144]
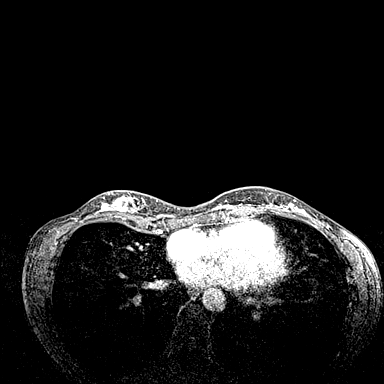
[im 58/144]
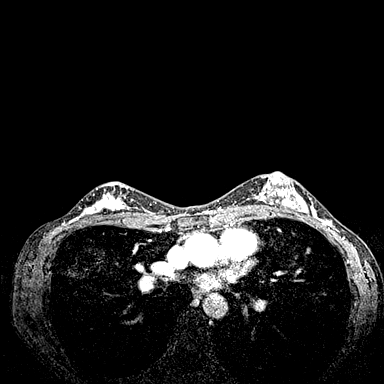
[im 86/144]
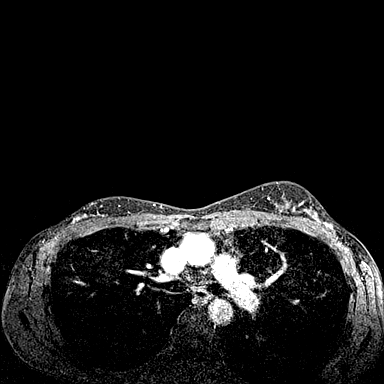
[im 115/144]
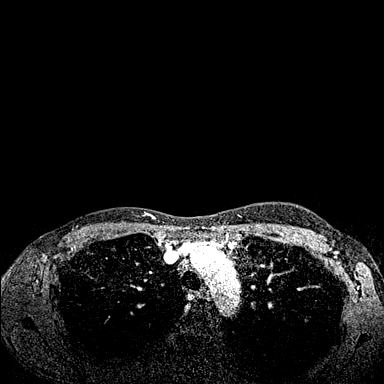
[im 144/144]
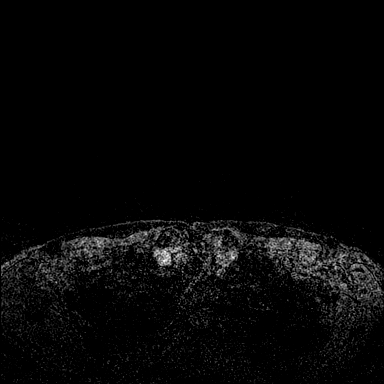

[Series 9: fl3d post-cm 3 · axial · 1.2mm · 0.86mm/px · z∈[-109,+27]mm · 5 of 144 slices shown (2 of 2)]
[im 1/144]
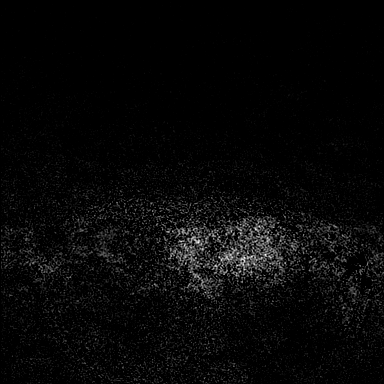
[im 29/144]
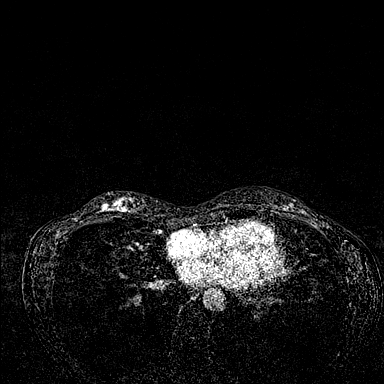
[im 58/144]
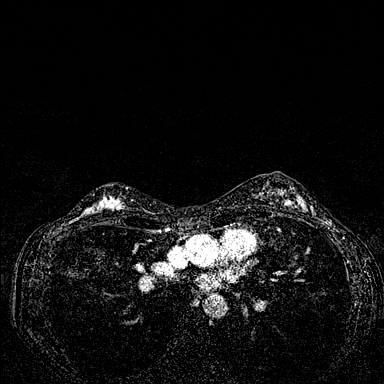
[im 86/144]
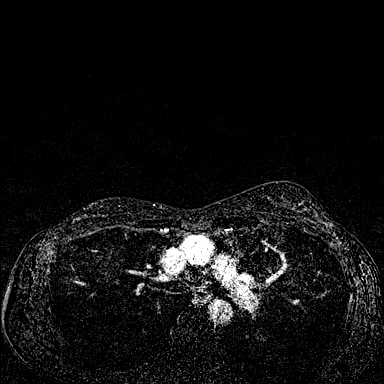
[im 115/144]
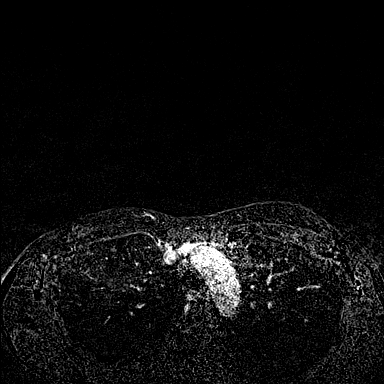

[33 of 48 positions shown; findings below may reference images not displayed]

Three-dimensional MR images were rendered by post-processing of the
original MR data on an independent workstation. The
three-dimensional MR images were interpreted, and findings are
reported in the following complete MRI report for this study. Three
dimensional images were evaluated at the independent interpreting
workstation using the DynaCAD thin client.
FINDINGS: Breast composition: c. Heterogeneous fibroglandular tissue.

Background parenchymal enhancement: Moderate.

Right breast: No mass or abnormal enhancement. Patchy parenchymal
enhancement is similar to the prior MRI exam. There are tissue
marker clip artifacts from numerous benign biopsies.

Left breast: No mass or abnormal enhancement. Patchy parenchymal
enhancement is similar to the prior MRI exam. There are tissue
marker clip artifact from numerous benign biopsies.

Lymph nodes: No abnormal appearing lymph nodes.

Ancillary findings:  None.
IMPRESSION: 1. Stable MRI appearance of both breasts.
2. No MRI evidence for malignancy.

RECOMMENDATION:
Bilateral diagnostic mammogram and possible bilateral breast
ultrasound has been recommended in [DATE].

BI-RADS CATEGORY  2: Benign.

## 2020-10-20 MED ORDER — GADOBUTROL 1 MMOL/ML IV SOLN
6.0000 mL | Freq: Once | INTRAVENOUS | Status: AC | PRN
  Administered 2020-10-20: 6 mL via INTRAVENOUS

## 2021-02-13 ENCOUNTER — Other Ambulatory Visit: Payer: Self-pay | Admitting: General Surgery

## 2021-02-13 DIAGNOSIS — N6489 Other specified disorders of breast: Secondary | ICD-10-CM

## 2021-03-24 ENCOUNTER — Other Ambulatory Visit: Payer: Self-pay | Admitting: General Surgery

## 2021-03-24 DIAGNOSIS — N6489 Other specified disorders of breast: Secondary | ICD-10-CM

## 2021-03-25 ENCOUNTER — Other Ambulatory Visit: Payer: Self-pay | Admitting: General Surgery

## 2021-03-25 ENCOUNTER — Ambulatory Visit: Payer: BC Managed Care – PPO

## 2021-03-25 ENCOUNTER — Ambulatory Visit
Admission: RE | Admit: 2021-03-25 | Discharge: 2021-03-25 | Disposition: A | Discharge status: Home / Self Care | Payer: BC Managed Care – PPO | Source: Ambulatory Visit | Attending: General Surgery | Admitting: General Surgery

## 2021-03-25 ENCOUNTER — Other Ambulatory Visit: Payer: Self-pay | Admitting: Internal Medicine

## 2021-03-25 DIAGNOSIS — N6489 Other specified disorders of breast: Secondary | ICD-10-CM

## 2021-03-25 IMAGING — US US BREAST*L* LIMITED INC AXILLA
1 series · 12 of 22 positions shown · non-contrast
Comparison: Previous exams.

CLINICAL DATA: 51-year-old female with history of multiple
biopsy-proven complex sclerosing lesions, including 3 sites in the
right breast and 4 sites in the left breast. Most recent
ultrasound-guided core biopsies were [DATE], with biopsy of
shadowing area in the right breast at the 12 o'clock position 1 cm
from nipple and mass in the left breast at 3 o'clock 2 cm from
nipple.
TECHNIQUE: Bilateral digital diagnostic mammography and breast tomosynthesis
was performed. The images were evaluated with computer-aided
detection.; Targeted ultrasound examination of the left breast was
performed.; Targeted ultrasound examination of the right breast was
performed

[Series 1: us breast*left* limited inc axilla · 0.05mm/px · 22 acquisitions, 12 frames shown]
[im 1/22]
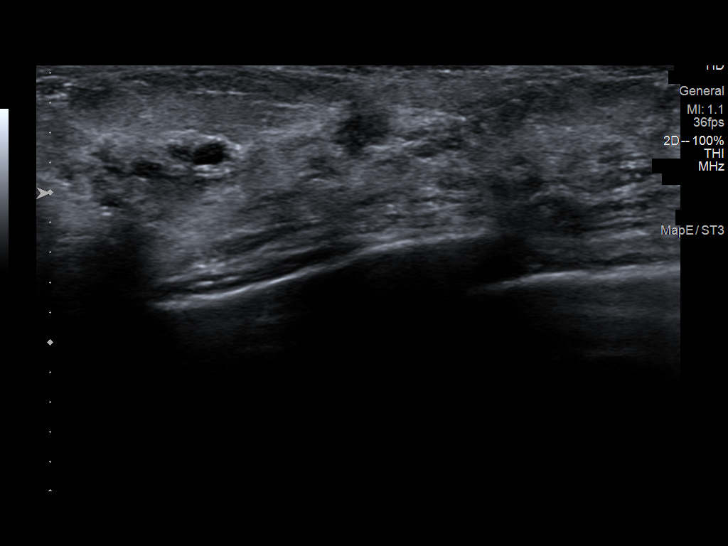
[im 3/22]
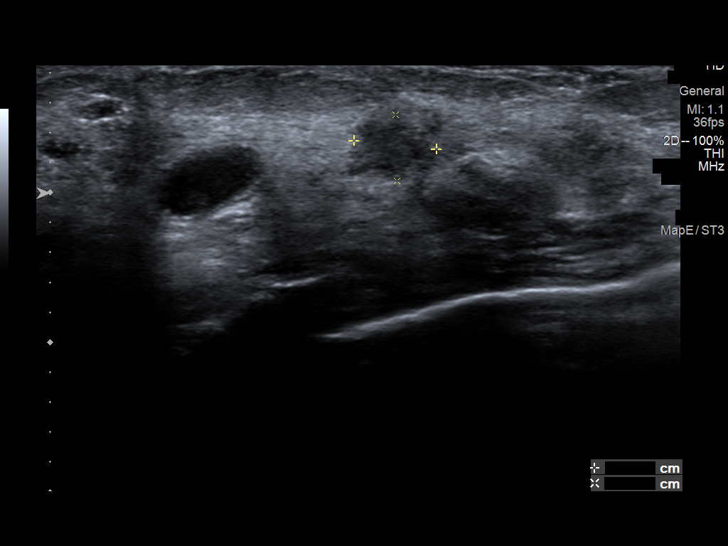
[im 5/22]
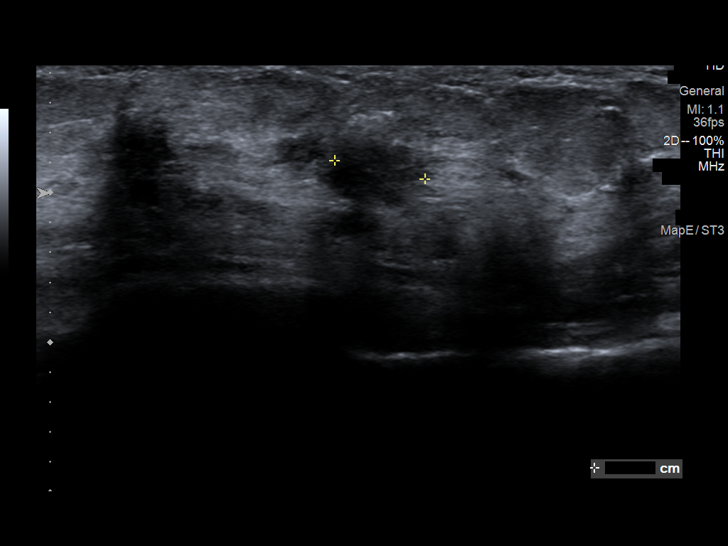
[im 7/22]
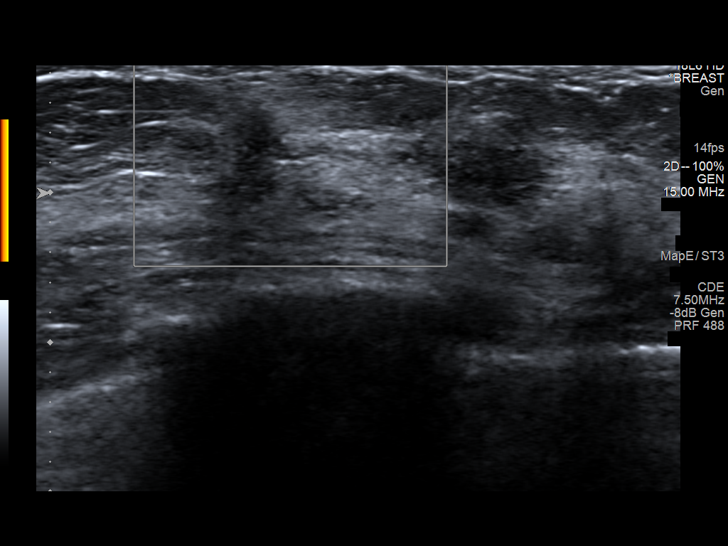
[im 9/22]
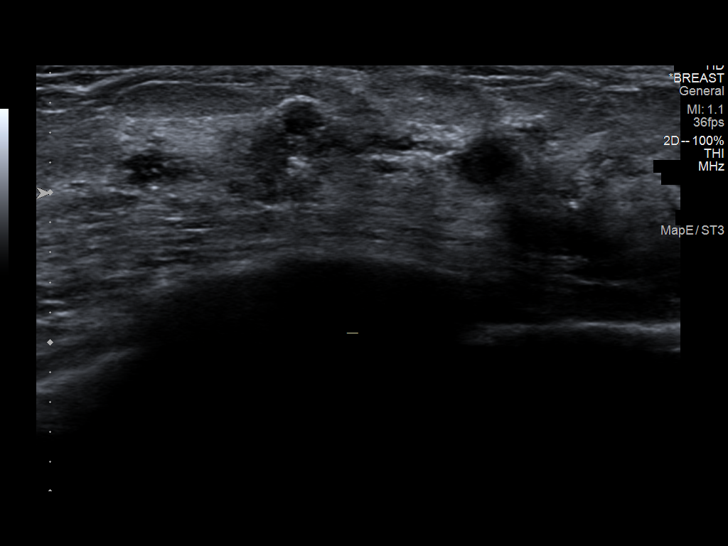
[im 11/22]
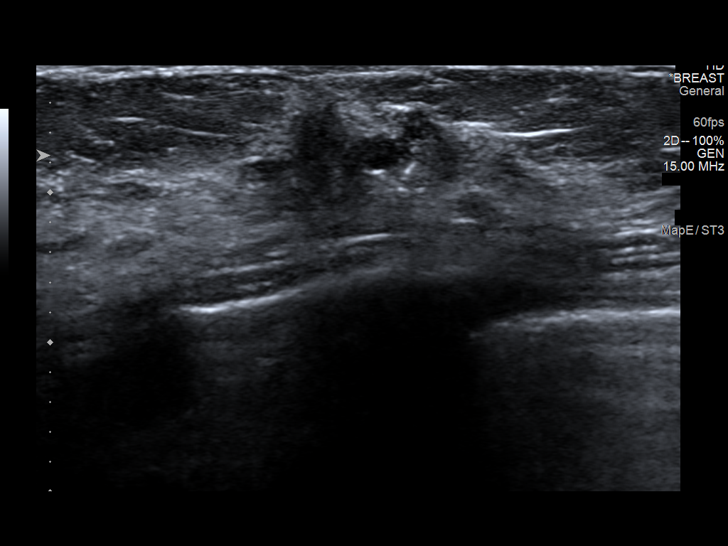
[im 12/22]
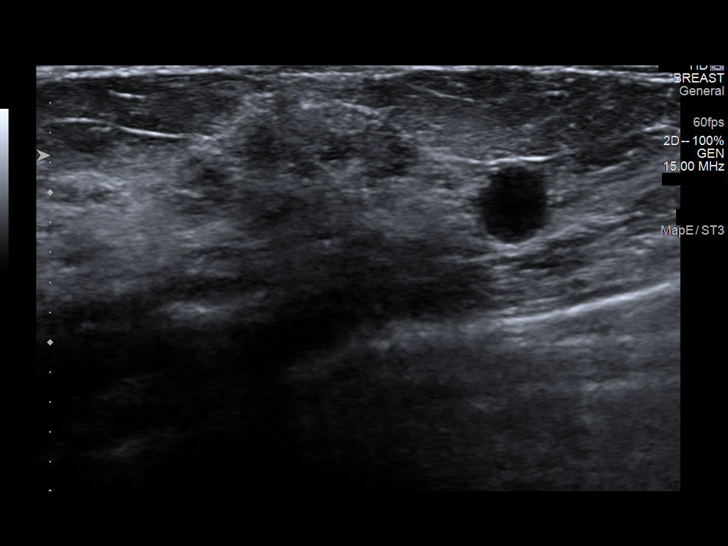
[im 14/22]
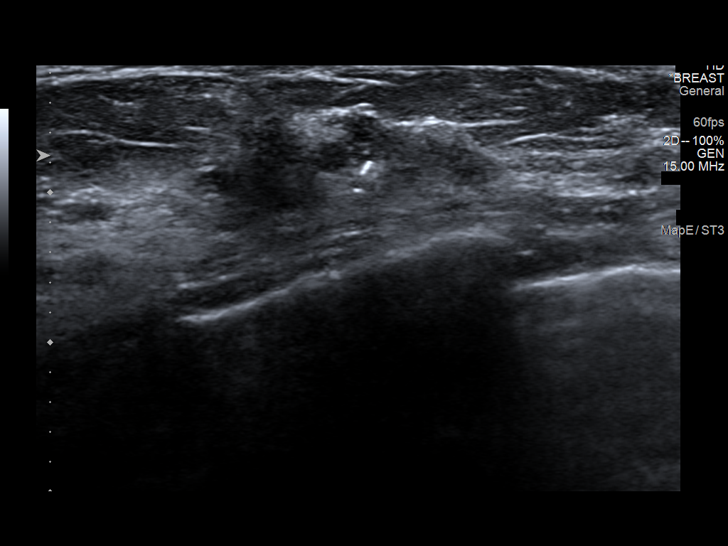
[im 16/22]
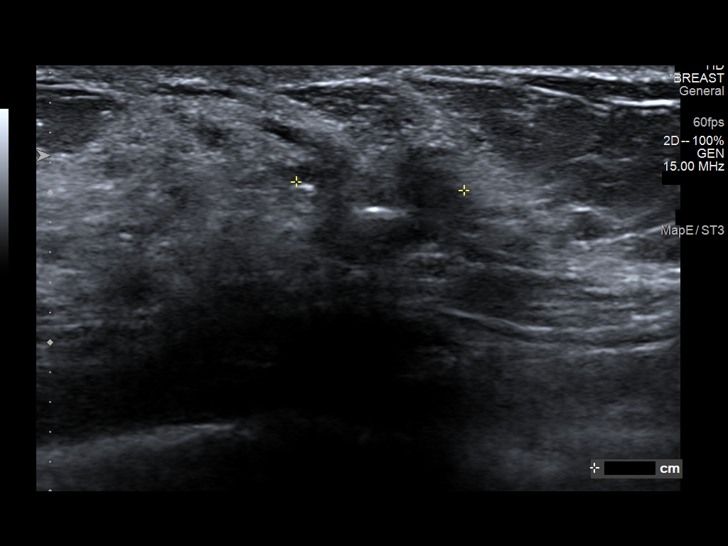
[im 18/22]
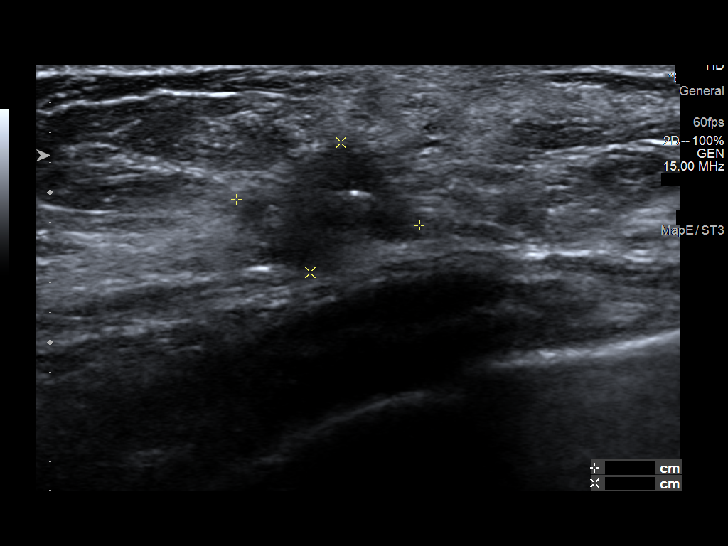
[im 20/22]
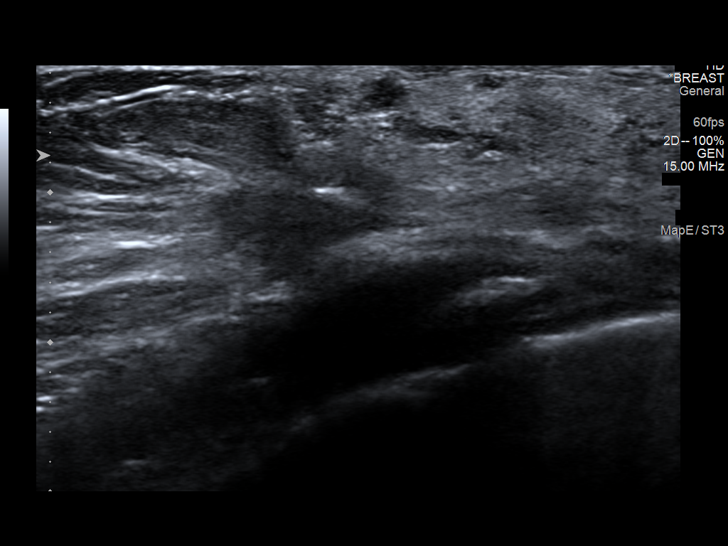
[im 22/22]
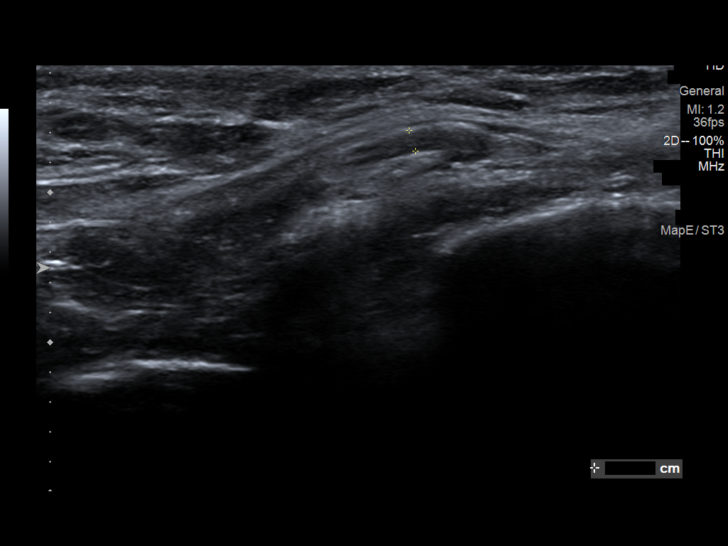

[12 of 22 positions shown; findings below may reference images not displayed]

Recent MRI [DATE] demonstrated stable MRI appearance of the
bilateral breasts with bilateral diagnostic mammography and possible
ultrasound recommended for follow-up.

EXAM:
DIGITAL DIAGNOSTIC BILATERAL MAMMOGRAM WITH TOMOSYNTHESIS AND CAD;
ULTRASOUND LEFT BREAST LIMITED; ULTRASOUND RIGHT BREAST LIMITED
ACR Breast Density Category c: The breast tissue is heterogeneously
dense, which may obscure small masses.
FINDINGS: Stable mammographic appearance of the bilateral breasts with 3
biopsy marking clips identified in the right breast and 4 biopsy
marking clips identified within the left breast. Distortion and
density surrounding the biopsy marking clips in the bilateral
breasts appears overall stable. No definite new masses or new areas
of distortion identified in either breast.

Targeted ultrasound of the right breast was performed. The
ill-defined shadowing area at site of biopsy-proven complex
sclerosing lesion in the right breast at 12 o'clock 1 cm from the
nipple measures 2.8 x 1.5 x 1.5 cm, previously 3 x 1.5 x 1.5 cm.
This is unchanged in appearance given differences in imaging and
measurement technique. No new masses identified sonographically.

Targeted ultrasound of the left breast was performed. The irregular
mass at site of biopsy proven complex sclerosing lesion in the left
breast at 3 o'clock 2 cm from nipple measures 0.6 x 0.4 x 0.6 cm,
previously 0.7 x 0.4 x 0.8 cm, overall unchanged in appearance.
Biopsy marking clip is also visualized in the left breast at 1
o'clock 3 cmfn at site of prior benign biopsy from [DATE],
similar appearance when compared to prior ultrasound. The irregular
mass in the left breast at 7 o'clock 1 cm from the nipple containing
biopsy marking clip measures 1.1 x 0.9 x 1.2 cm, previously 1.5 x
1.1 x 1 cm, also unchanged. No new masses identified
sonographically.
IMPRESSION: Overall stable appearance of the bilateral breasts with areas of
distortion and density at sites of biopsy-proven complex sclerosing
lesions in each breast. No definite new masses or abnormalities
identified in either breast.

RECOMMENDATION:
1. Diagnostic mammogram is suggested in 1 year. (Code:[BX])

2. Strongly recommend annual imaging surveillance with breast MRI
(due [DATE]) given the mammographic appearance of the
bilateral breasts with multiple areas of distortion and biopsy
related change. In addition, these masses are difficult to
follow/compare sonographically given their ill-defined sonographic
appearance.

I have discussed the findings and recommendations with the patient.
If applicable, a reminder letter will be sent to the patient
regarding the next appointment.

BI-RADS CATEGORY  2: Benign.

## 2021-03-25 IMAGING — MG DIGITAL DIAGNOSTIC BILAT W/ TOMO W/ CAD
6 of 12 series · 6 of 36 positions shown · non-contrast
Comparison: Previous exams.

CLINICAL DATA: 51-year-old female with history of multiple
biopsy-proven complex sclerosing lesions, including 3 sites in the
right breast and 4 sites in the left breast. Most recent
ultrasound-guided core biopsies were [DATE], with biopsy of
shadowing area in the right breast at the 12 o'clock position 1 cm
from nipple and mass in the left breast at 3 o'clock 2 cm from
nipple.
TECHNIQUE: Bilateral digital diagnostic mammography and breast tomosynthesis
was performed. The images were evaluated with computer-aided
detection.; Targeted ultrasound examination of the left breast was
performed.; Targeted ultrasound examination of the right breast was
performed

[L MLO synth-2D]
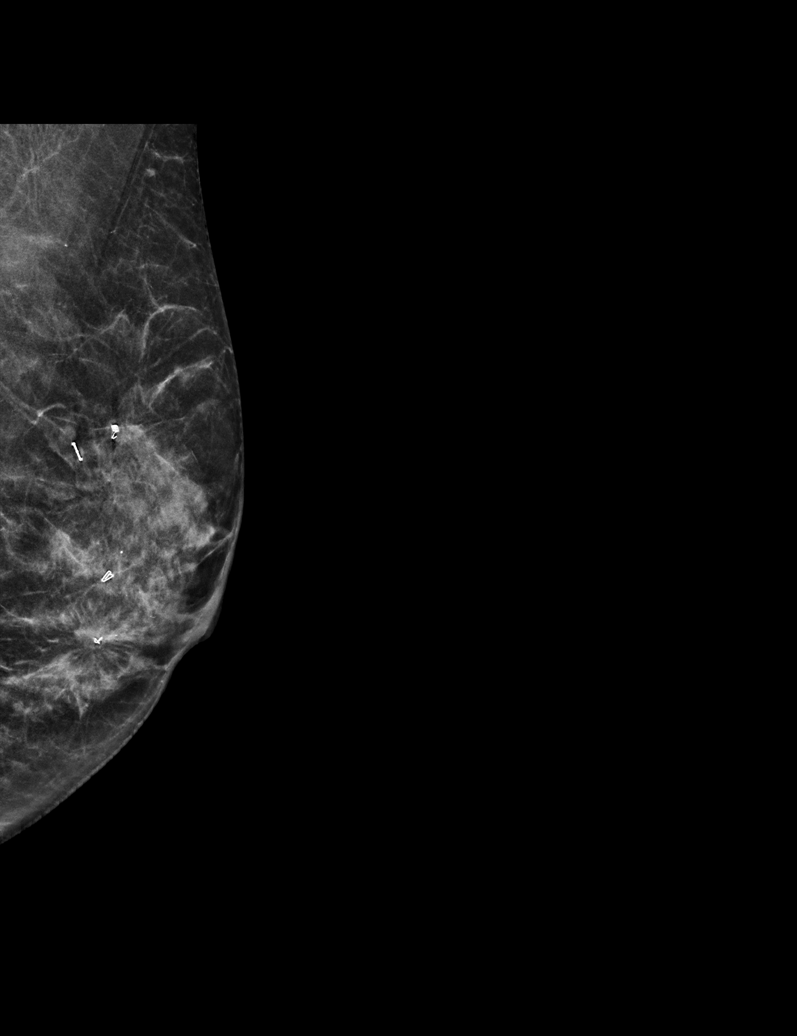

[R CC synth-2D (1 of 3)]
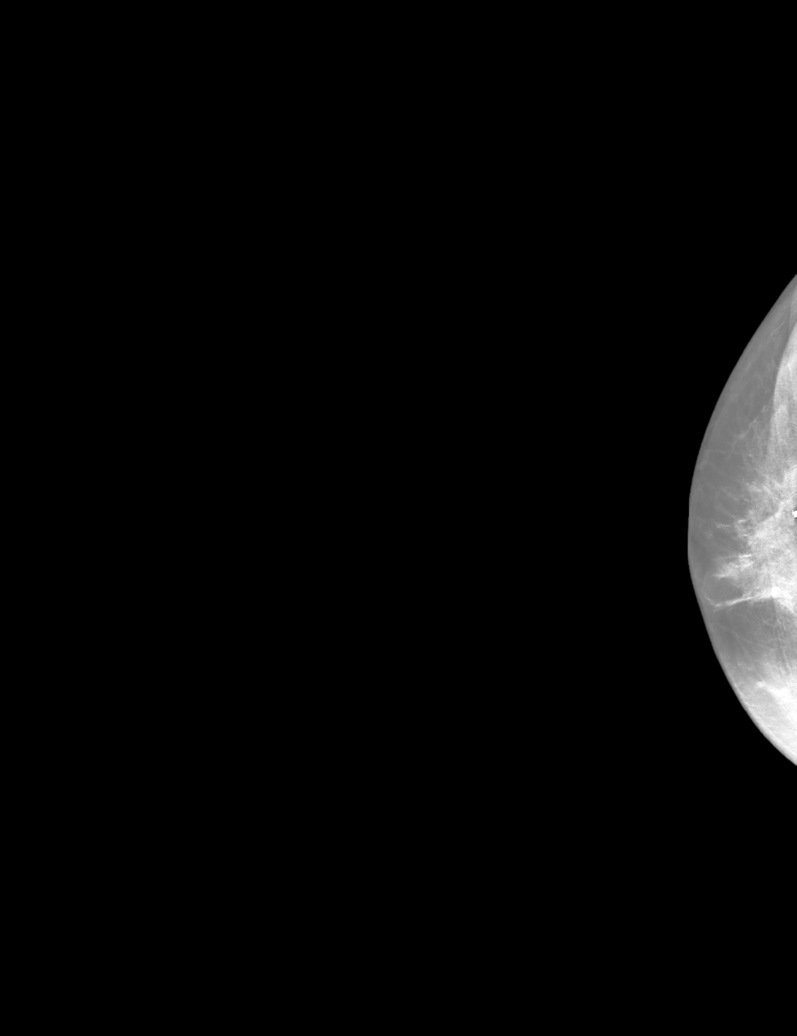

[L CC synth-2D]
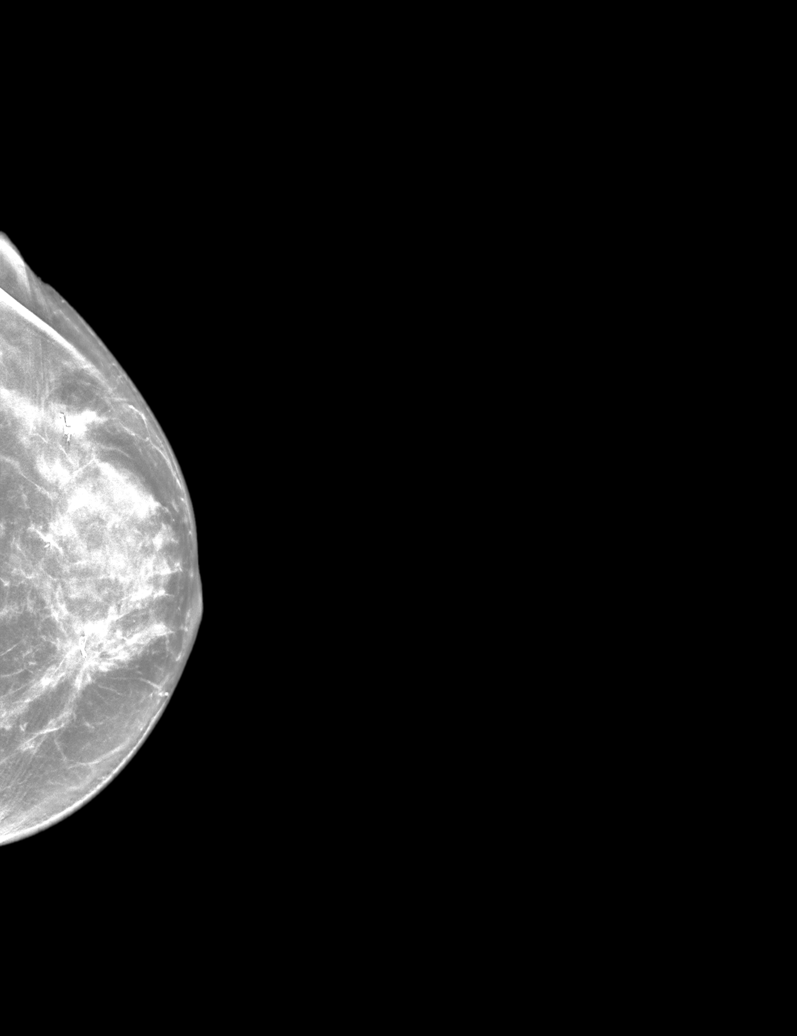

[R MLO synth-2D]
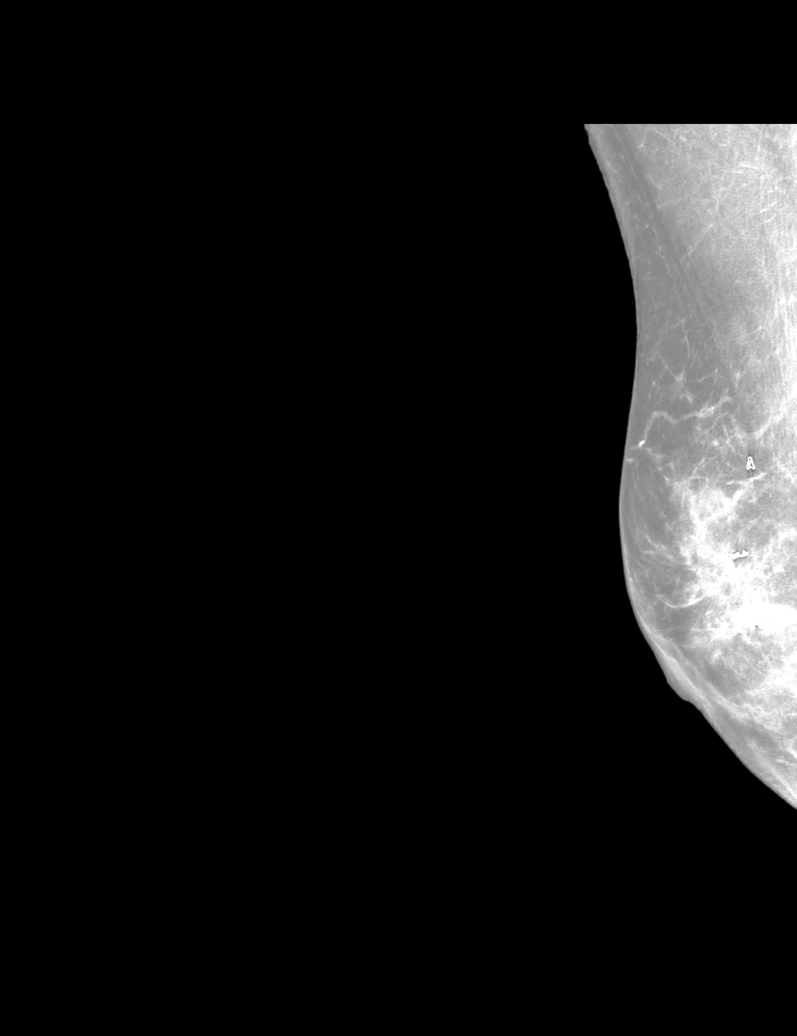

[R CC synth-2D (2 of 3)]
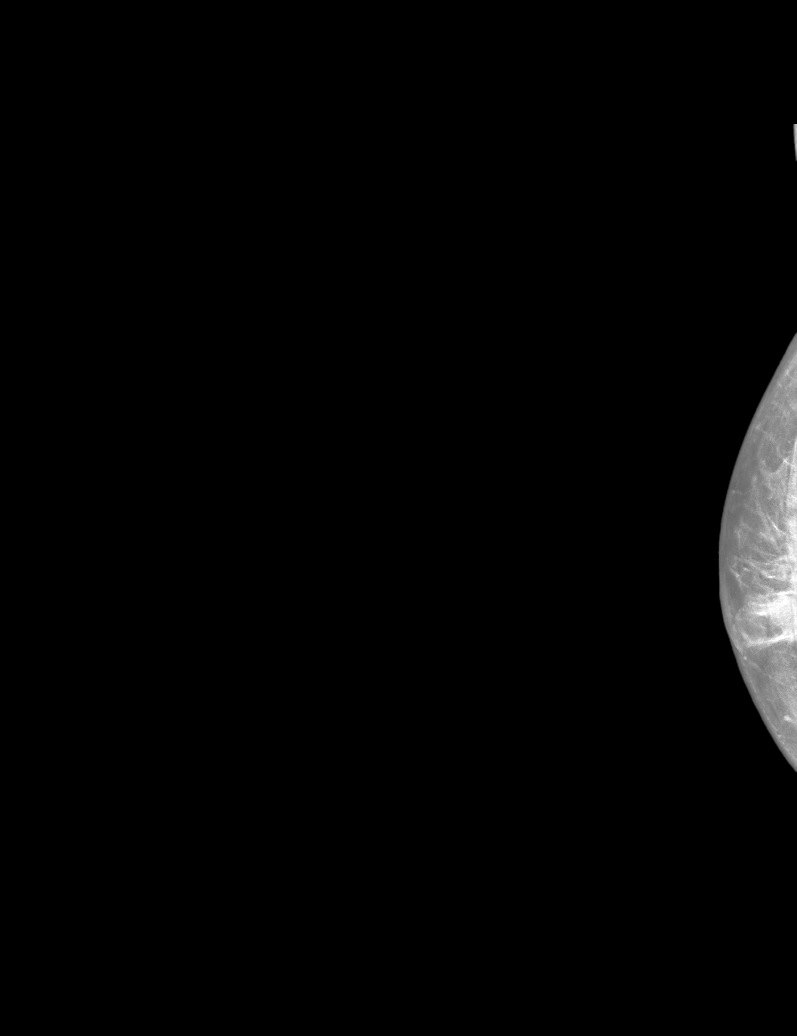

[R CC synth-2D (3 of 3)]
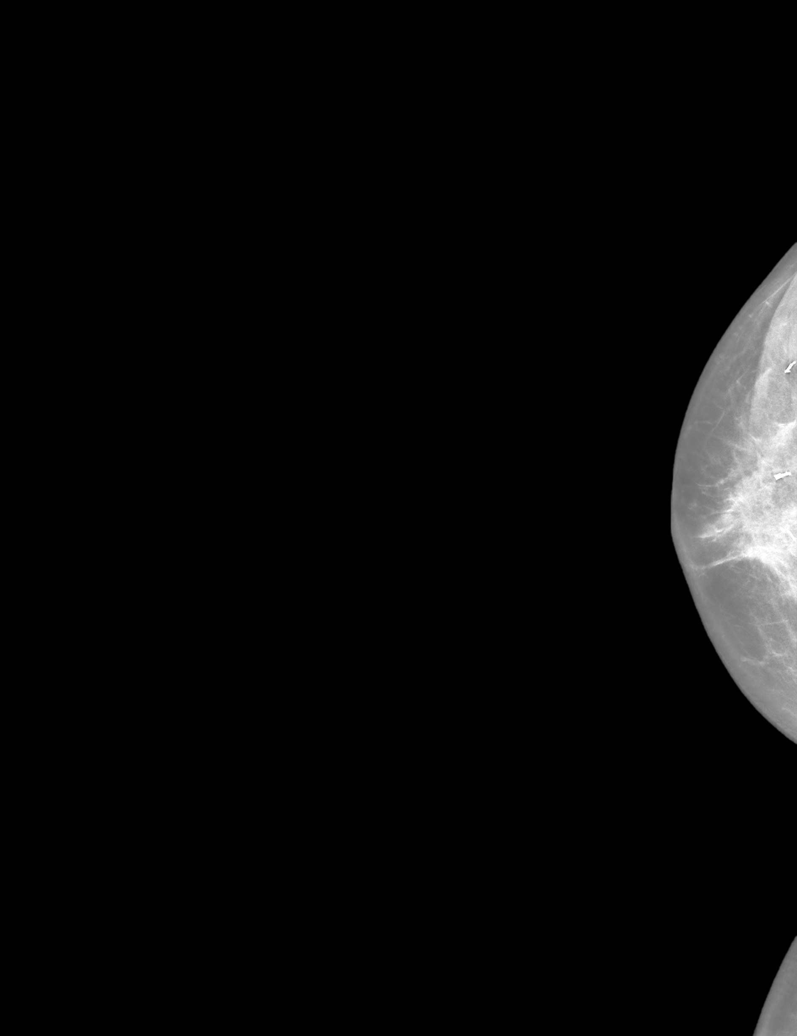

[6 of 36 positions shown; findings below may reference images not displayed]

Recent MRI [DATE] demonstrated stable MRI appearance of the
bilateral breasts with bilateral diagnostic mammography and possible
ultrasound recommended for follow-up.

EXAM:
DIGITAL DIAGNOSTIC BILATERAL MAMMOGRAM WITH TOMOSYNTHESIS AND CAD;
ULTRASOUND LEFT BREAST LIMITED; ULTRASOUND RIGHT BREAST LIMITED
ACR Breast Density Category c: The breast tissue is heterogeneously
dense, which may obscure small masses.
FINDINGS: Stable mammographic appearance of the bilateral breasts with 3
biopsy marking clips identified in the right breast and 4 biopsy
marking clips identified within the left breast. Distortion and
density surrounding the biopsy marking clips in the bilateral
breasts appears overall stable. No definite new masses or new areas
of distortion identified in either breast.

Targeted ultrasound of the right breast was performed. The
ill-defined shadowing area at site of biopsy-proven complex
sclerosing lesion in the right breast at 12 o'clock 1 cm from the
nipple measures 2.8 x 1.5 x 1.5 cm, previously 3 x 1.5 x 1.5 cm.
This is unchanged in appearance given differences in imaging and
measurement technique. No new masses identified sonographically.

Targeted ultrasound of the left breast was performed. The irregular
mass at site of biopsy proven complex sclerosing lesion in the left
breast at 3 o'clock 2 cm from nipple measures 0.6 x 0.4 x 0.6 cm,
previously 0.7 x 0.4 x 0.8 cm, overall unchanged in appearance.
Biopsy marking clip is also visualized in the left breast at 1
o'clock 3 cmfn at site of prior benign biopsy from [DATE],
similar appearance when compared to prior ultrasound. The irregular
mass in the left breast at 7 o'clock 1 cm from the nipple containing
biopsy marking clip measures 1.1 x 0.9 x 1.2 cm, previously 1.5 x
1.1 x 1 cm, also unchanged. No new masses identified
sonographically.
IMPRESSION: Overall stable appearance of the bilateral breasts with areas of
distortion and density at sites of biopsy-proven complex sclerosing
lesions in each breast. No definite new masses or abnormalities
identified in either breast.

RECOMMENDATION:
1. Diagnostic mammogram is suggested in 1 year. (Code:[BX])

2. Strongly recommend annual imaging surveillance with breast MRI
(due [DATE]) given the mammographic appearance of the
bilateral breasts with multiple areas of distortion and biopsy
related change. In addition, these masses are difficult to
follow/compare sonographically given their ill-defined sonographic
appearance.

I have discussed the findings and recommendations with the patient.
If applicable, a reminder letter will be sent to the patient
regarding the next appointment.

BI-RADS CATEGORY  2: Benign.

## 2021-03-25 IMAGING — US US BREAST*R* LIMITED INC AXILLA
1 series · 5 of 5 positions shown · non-contrast
Comparison: Previous exams.

CLINICAL DATA: 51-year-old female with history of multiple
biopsy-proven complex sclerosing lesions, including 3 sites in the
right breast and 4 sites in the left breast. Most recent
ultrasound-guided core biopsies were [DATE], with biopsy of
shadowing area in the right breast at the 12 o'clock position 1 cm
from nipple and mass in the left breast at 3 o'clock 2 cm from
nipple.
TECHNIQUE: Bilateral digital diagnostic mammography and breast tomosynthesis
was performed. The images were evaluated with computer-aided
detection.; Targeted ultrasound examination of the left breast was
performed.; Targeted ultrasound examination of the right breast was
performed

[Series 1: us breast*right* limited inc axilla · 0.06mm/px · 5 of 5 slices shown]
[im 1/5]
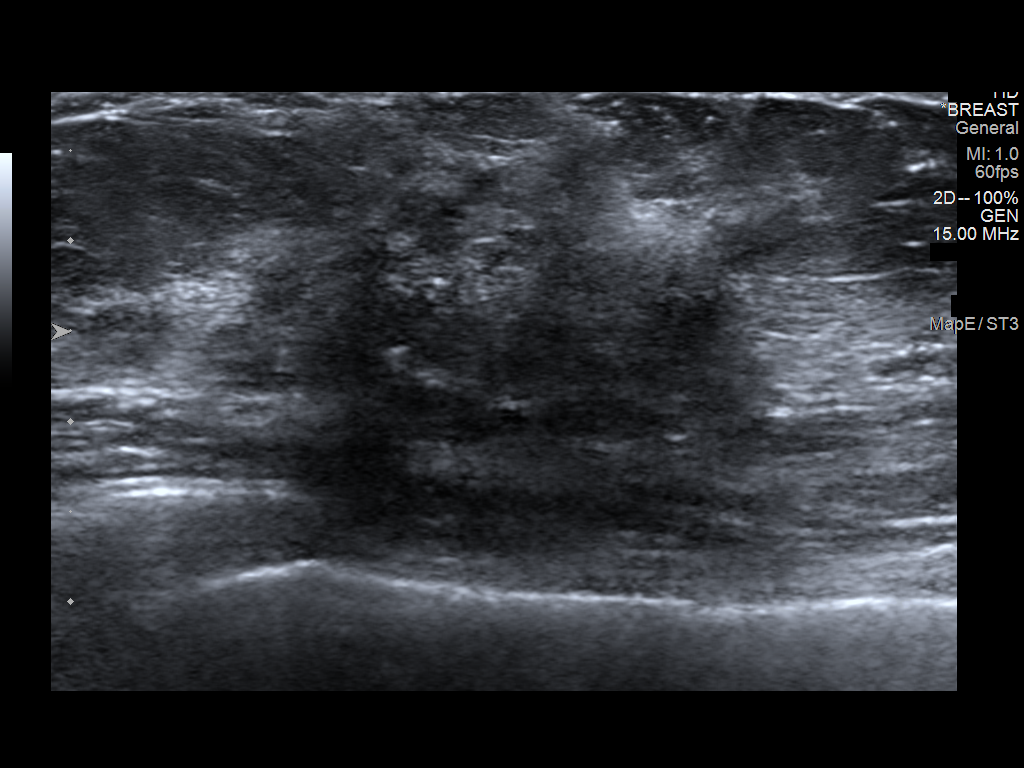
[im 2/5]
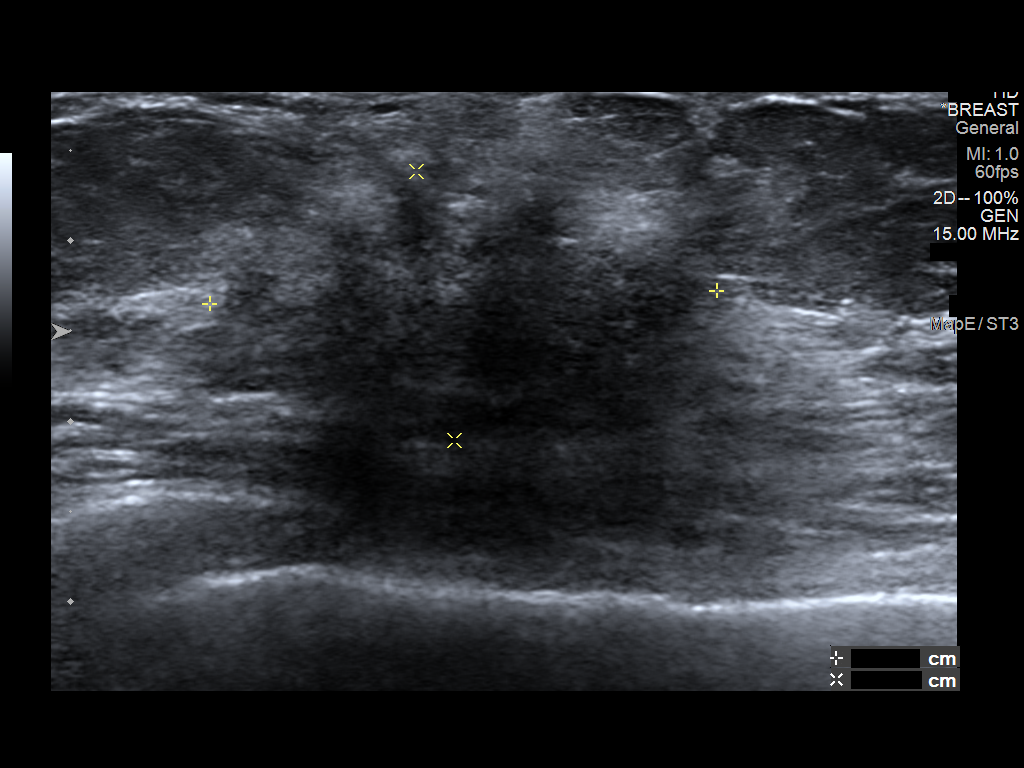
[im 3/5]
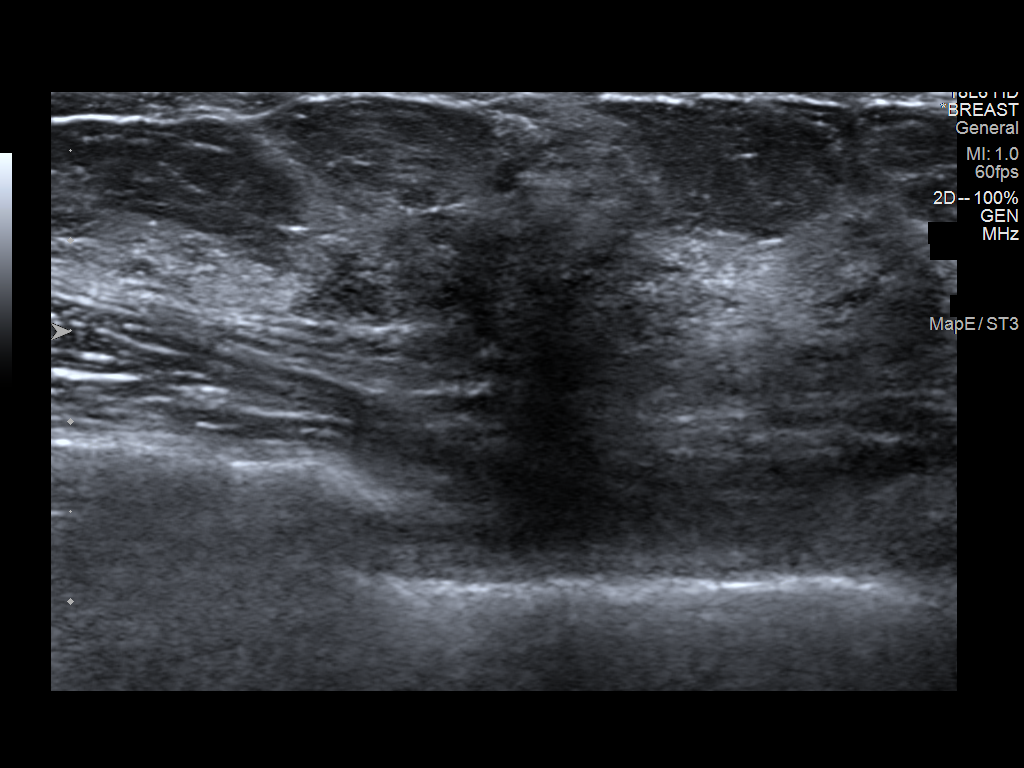
[im 4/5]
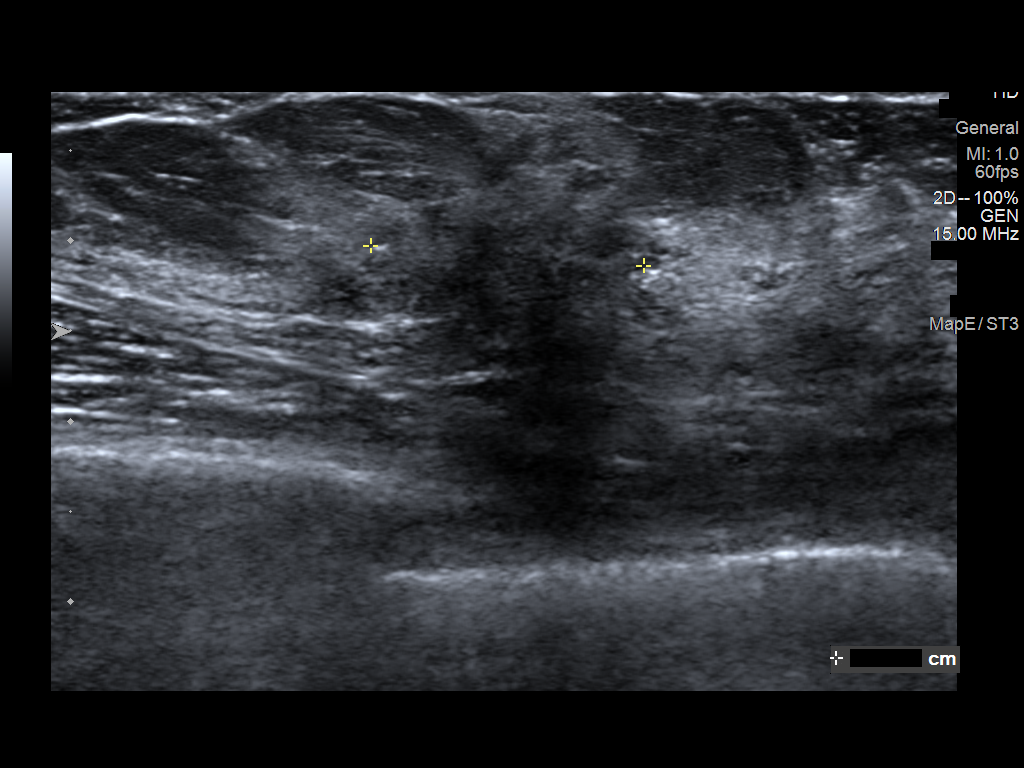
[im 5/5]
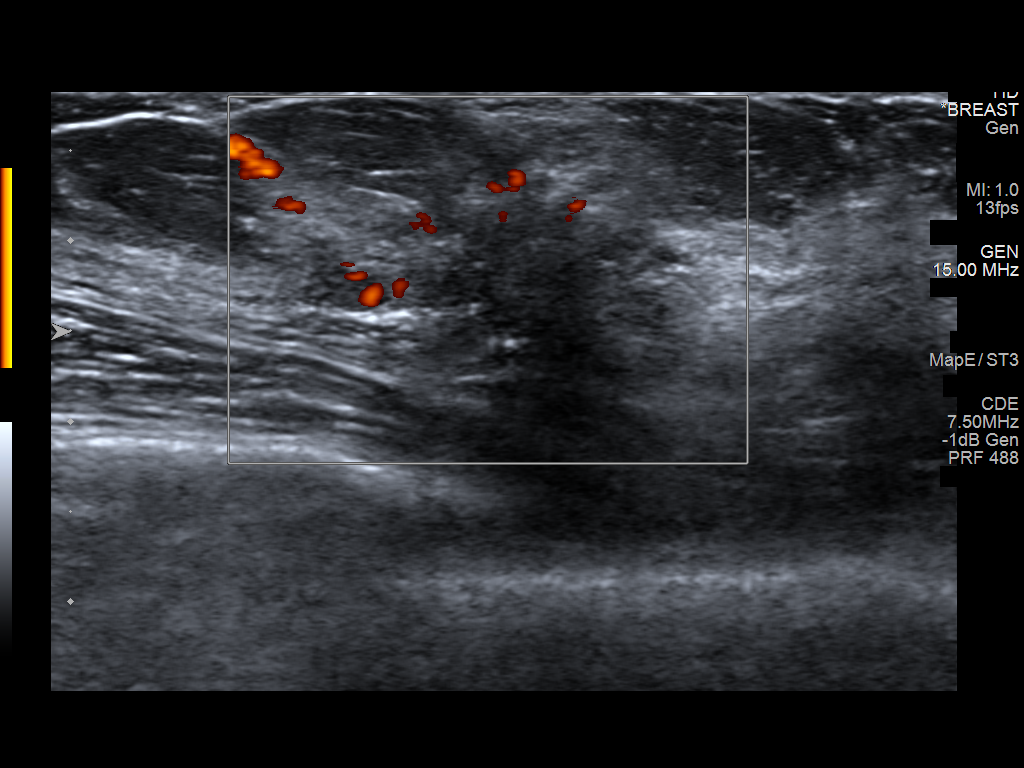

[5 of 5 positions shown; findings below may reference images not displayed]

Recent MRI [DATE] demonstrated stable MRI appearance of the
bilateral breasts with bilateral diagnostic mammography and possible
ultrasound recommended for follow-up.

EXAM:
DIGITAL DIAGNOSTIC BILATERAL MAMMOGRAM WITH TOMOSYNTHESIS AND CAD;
ULTRASOUND LEFT BREAST LIMITED; ULTRASOUND RIGHT BREAST LIMITED
ACR Breast Density Category c: The breast tissue is heterogeneously
dense, which may obscure small masses.
FINDINGS: Stable mammographic appearance of the bilateral breasts with 3
biopsy marking clips identified in the right breast and 4 biopsy
marking clips identified within the left breast. Distortion and
density surrounding the biopsy marking clips in the bilateral
breasts appears overall stable. No definite new masses or new areas
of distortion identified in either breast.

Targeted ultrasound of the right breast was performed. The
ill-defined shadowing area at site of biopsy-proven complex
sclerosing lesion in the right breast at 12 o'clock 1 cm from the
nipple measures 2.8 x 1.5 x 1.5 cm, previously 3 x 1.5 x 1.5 cm.
This is unchanged in appearance given differences in imaging and
measurement technique. No new masses identified sonographically.

Targeted ultrasound of the left breast was performed. The irregular
mass at site of biopsy proven complex sclerosing lesion in the left
breast at 3 o'clock 2 cm from nipple measures 0.6 x 0.4 x 0.6 cm,
previously 0.7 x 0.4 x 0.8 cm, overall unchanged in appearance.
Biopsy marking clip is also visualized in the left breast at 1
o'clock 3 cmfn at site of prior benign biopsy from [DATE],
similar appearance when compared to prior ultrasound. The irregular
mass in the left breast at 7 o'clock 1 cm from the nipple containing
biopsy marking clip measures 1.1 x 0.9 x 1.2 cm, previously 1.5 x
1.1 x 1 cm, also unchanged. No new masses identified
sonographically.
IMPRESSION: Overall stable appearance of the bilateral breasts with areas of
distortion and density at sites of biopsy-proven complex sclerosing
lesions in each breast. No definite new masses or abnormalities
identified in either breast.

RECOMMENDATION:
1. Diagnostic mammogram is suggested in 1 year. (Code:[BX])

2. Strongly recommend annual imaging surveillance with breast MRI
(due [DATE]) given the mammographic appearance of the
bilateral breasts with multiple areas of distortion and biopsy
related change. In addition, these masses are difficult to
follow/compare sonographically given their ill-defined sonographic
appearance.

I have discussed the findings and recommendations with the patient.
If applicable, a reminder letter will be sent to the patient
regarding the next appointment.

BI-RADS CATEGORY  2: Benign.

## 2021-09-22 ENCOUNTER — Other Ambulatory Visit: Payer: Self-pay | Admitting: General Surgery

## 2021-09-22 DIAGNOSIS — N6489 Other specified disorders of breast: Secondary | ICD-10-CM

## 2021-09-22 DIAGNOSIS — Z1239 Encounter for other screening for malignant neoplasm of breast: Secondary | ICD-10-CM

## 2021-10-17 ENCOUNTER — Ambulatory Visit
Admission: RE | Admit: 2021-10-17 | Discharge: 2021-10-17 | Disposition: A | Discharge status: Home / Self Care | Payer: BC Managed Care – PPO | Source: Ambulatory Visit | Attending: General Surgery | Admitting: General Surgery

## 2021-10-17 DIAGNOSIS — Z1239 Encounter for other screening for malignant neoplasm of breast: Secondary | ICD-10-CM

## 2021-10-17 DIAGNOSIS — N6489 Other specified disorders of breast: Secondary | ICD-10-CM

## 2021-10-17 MED ORDER — GADOPICLENOL 0.5 MMOL/ML IV SOLN
5.0000 mL | Freq: Once | INTRAVENOUS | Status: AC | PRN
  Administered 2021-10-17: 5 mL via INTRAVENOUS

## 2022-08-17 ENCOUNTER — Other Ambulatory Visit: Payer: Self-pay | Admitting: General Surgery

## 2022-08-17 DIAGNOSIS — Z1231 Encounter for screening mammogram for malignant neoplasm of breast: Secondary | ICD-10-CM

## 2022-08-17 DIAGNOSIS — N6489 Other specified disorders of breast: Secondary | ICD-10-CM

## 2022-08-19 ENCOUNTER — Other Ambulatory Visit: Payer: Self-pay | Admitting: General Surgery

## 2022-08-19 DIAGNOSIS — N6489 Other specified disorders of breast: Secondary | ICD-10-CM

## 2022-08-30 ENCOUNTER — Ambulatory Visit
Admission: RE | Admit: 2022-08-30 | Discharge: 2022-08-30 | Disposition: A | Discharge status: Home / Self Care | Payer: BC Managed Care – PPO | Source: Ambulatory Visit | Attending: General Surgery | Admitting: General Surgery

## 2022-08-30 DIAGNOSIS — N6489 Other specified disorders of breast: Secondary | ICD-10-CM

## 2022-09-02 ENCOUNTER — Other Ambulatory Visit: Payer: Self-pay | Admitting: General Surgery

## 2022-09-02 DIAGNOSIS — R928 Other abnormal and inconclusive findings on diagnostic imaging of breast: Secondary | ICD-10-CM

## 2022-09-13 ENCOUNTER — Ambulatory Visit
Admission: RE | Admit: 2022-09-13 | Discharge: 2022-09-13 | Disposition: A | Discharge status: Home / Self Care | Payer: BC Managed Care – PPO | Source: Ambulatory Visit | Attending: General Surgery | Admitting: General Surgery

## 2022-09-13 ENCOUNTER — Ambulatory Visit: Payer: BC Managed Care – PPO

## 2022-09-13 DIAGNOSIS — R928 Other abnormal and inconclusive findings on diagnostic imaging of breast: Secondary | ICD-10-CM

## 2023-03-14 ENCOUNTER — Other Ambulatory Visit: Payer: Self-pay | Admitting: General Surgery

## 2023-03-14 DIAGNOSIS — Z1239 Encounter for other screening for malignant neoplasm of breast: Secondary | ICD-10-CM

## 2023-03-14 DIAGNOSIS — N6489 Other specified disorders of breast: Secondary | ICD-10-CM

## 2023-04-13 ENCOUNTER — Other Ambulatory Visit: Payer: Self-pay

## 2023-12-25 DIAGNOSIS — I361 Nonrheumatic tricuspid (valve) insufficiency: Secondary | ICD-10-CM | POA: Diagnosis not present
